# Patient Record
Sex: Male | Born: 1985
Health system: Southern US, Community
[De-identification: ages and names within clinical notes are randomized; demographics above are authoritative.]

## PROBLEM LIST (undated history)

## (undated) DIAGNOSIS — N433 Hydrocele, unspecified: Secondary | ICD-10-CM

## (undated) DIAGNOSIS — K219 Gastro-esophageal reflux disease without esophagitis: Secondary | ICD-10-CM

## (undated) DIAGNOSIS — E785 Hyperlipidemia, unspecified: Secondary | ICD-10-CM

## (undated) HISTORY — PX: HYDROCELE EXCISION: SHX482

## (undated) HISTORY — PX: OTHER SURGICAL HISTORY: SHX169

---

## 2007-10-15 ENCOUNTER — Emergency Department (HOSPITAL_COMMUNITY): Admission: EM | Admit: 2007-10-15 | Discharge: 2007-10-15 | Payer: Self-pay | Admitting: Emergency Medicine

## 2008-08-09 ENCOUNTER — Emergency Department (HOSPITAL_COMMUNITY): Admission: EM | Admit: 2008-08-09 | Discharge: 2008-08-09 | Payer: Self-pay | Admitting: Emergency Medicine

## 2009-08-25 ENCOUNTER — Ambulatory Visit (HOSPITAL_BASED_OUTPATIENT_CLINIC_OR_DEPARTMENT_OTHER): Admission: RE | Admit: 2009-08-25 | Discharge: 2009-08-25 | Payer: Self-pay | Admitting: Urology

## 2010-09-11 LAB — POCT HEMOGLOBIN-HEMACUE: Hemoglobin: 15.2 g/dL (ref 13.0–17.0)

## 2010-10-04 LAB — CBC
MCV: 88 fL (ref 78.0–100.0)
Platelets: 288 10*3/uL (ref 150–400)
RBC: 4.89 MIL/uL (ref 4.22–5.81)
WBC: 8.8 10*3/uL (ref 4.0–10.5)

## 2011-03-14 LAB — URINALYSIS, ROUTINE W REFLEX MICROSCOPIC
Glucose, UA: NEGATIVE
Ketones, ur: NEGATIVE
Nitrite: NEGATIVE
Specific Gravity, Urine: 1.01
pH: 6.5

## 2011-12-24 ENCOUNTER — Ambulatory Visit: Payer: Self-pay | Admitting: Family Medicine

## 2011-12-24 ENCOUNTER — Other Ambulatory Visit: Payer: Self-pay | Admitting: Family Medicine

## 2011-12-24 ENCOUNTER — Ambulatory Visit: Payer: Self-pay | Admitting: Emergency Medicine

## 2011-12-24 VITALS — BP 112/68 | HR 78 | Temp 98.5°F | Resp 16 | Ht 67.0 in | Wt 189.4 lb

## 2011-12-24 DIAGNOSIS — R1013 Epigastric pain: Secondary | ICD-10-CM

## 2011-12-24 DIAGNOSIS — R5383 Other fatigue: Secondary | ICD-10-CM

## 2011-12-24 DIAGNOSIS — E785 Hyperlipidemia, unspecified: Secondary | ICD-10-CM

## 2011-12-24 DIAGNOSIS — R5381 Other malaise: Secondary | ICD-10-CM

## 2011-12-24 LAB — POCT CBC
Granulocyte percent: 64.7 %G (ref 37–80)
HCT, POC: 40.9 % — AB (ref 43.5–53.7)
Hemoglobin: 12.5 g/dL — AB (ref 14.1–18.1)
Lymph, poc: 2 (ref 0.6–3.4)
MCH, POC: 28 pg (ref 27–31.2)
MCHC: 30.6 g/dL — AB (ref 31.8–35.4)
MCV: 91.6 fL (ref 80–97)
MID (cbc): 0.6 (ref 0–0.9)
MPV: 8.1 fL (ref 0–99.8)
POC Granulocyte: 4.8 (ref 2–6.9)
POC LYMPH PERCENT: 26.9 %L (ref 10–50)
POC MID %: 8.4 %M (ref 0–12)
Platelet Count, POC: 311 10*3/uL (ref 142–424)
RBC: 4.47 M/uL — AB (ref 4.69–6.13)
RDW, POC: 13.6 %
WBC: 7.4 10*3/uL (ref 4.6–10.2)

## 2011-12-24 LAB — LIPID PANEL
Cholesterol: 202 mg/dL — ABNORMAL HIGH (ref 0–200)
HDL: 35 mg/dL — ABNORMAL LOW (ref >39)
Total CHOL/HDL Ratio: 5.8 Ratio
Triglycerides: 418 mg/dL — ABNORMAL HIGH (ref <150)

## 2011-12-24 LAB — COMPREHENSIVE METABOLIC PANEL
ALT: 22 U/L (ref 0–53)
AST: 19 U/L (ref 0–37)
Albumin: 5 g/dL (ref 3.5–5.2)
Alkaline Phosphatase: 80 U/L (ref 39–117)
BUN: 9 mg/dL (ref 6–23)
CO2: 27 mEq/L (ref 19–32)
Calcium: 9.7 mg/dL (ref 8.4–10.5)
Chloride: 104 mEq/L (ref 96–112)
Creat: 0.81 mg/dL (ref 0.50–1.35)
Glucose, Bld: 98 mg/dL (ref 70–99)
Potassium: 4.4 mEq/L (ref 3.5–5.3)
Sodium: 138 mEq/L (ref 135–145)
Total Bilirubin: 0.4 mg/dL (ref 0.3–1.2)
Total Protein: 7.2 g/dL (ref 6.0–8.3)

## 2011-12-24 LAB — TSH: TSH: 1.272 u[IU]/mL (ref 0.350–4.500)

## 2011-12-24 MED ORDER — RANITIDINE HCL 300 MG PO TABS: 300.0000 mg | ORAL_TABLET | Freq: Every day | ORAL | Status: DC

## 2011-12-24 NOTE — Progress Notes (Signed)
This is a 26 year old Hispanic man with complaints of fatigue, epigastric distress, and history of elevated triglycerides. 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 He's been under a lot of stress lately he thinks that may be contributing to difficulty sleeping, epigastric distress. He had a cholesterol check last year and was found to have high triglycerides.  Father is an alcoholic and lives in [place]  Objective: HEENT: Without abnormality patient is in no acute distress Chest: Clear Heart: Regular no murmur gallop or rub Abdomen: Soft nontender without HSM, or masses Skin: No rashes or ecchymosis  Assessment: 26 year old under great stress  Plan: Check chemistry survey and lipids Ranitidine when necessary for epigastric distress Consider fluoxetine for stress once labs are back

## 2011-12-25 LAB — FERRITIN: Ferritin: 144 ng/mL (ref 22–322)

## 2012-01-07 ENCOUNTER — Ambulatory Visit: Payer: Self-pay | Admitting: Emergency Medicine

## 2012-01-07 VITALS — BP 126/74 | HR 80 | Temp 97.9°F | Resp 17 | Ht 68.0 in | Wt 190.0 lb

## 2012-01-07 DIAGNOSIS — K219 Gastro-esophageal reflux disease without esophagitis: Secondary | ICD-10-CM

## 2012-01-07 DIAGNOSIS — E781 Pure hyperglyceridemia: Secondary | ICD-10-CM

## 2012-01-07 DIAGNOSIS — G4700 Insomnia, unspecified: Secondary | ICD-10-CM

## 2012-01-07 MED ORDER — FENOFIBRATE 145 MG PO TABS: 145.0000 mg | ORAL_TABLET | Freq: Every day | ORAL | Status: DC

## 2012-01-07 MED ORDER — FLUOXETINE HCL 20 MG PO TABS: 20.0000 mg | ORAL_TABLET | Freq: Every day | ORAL | Status: DC

## 2012-01-07 NOTE — Patient Instructions (Signed)
Hipertrigliceridemia  Dieta para niveles altos de triglicridos en sangre (Hypertriglyceridemia, Diet for High blood levels of Triglycerides) La mayora de las grasas que contienen los alimentos son los triglicridos. Los triglicridos en la sangre se almacenan como grasa en el cuerpo. Niveles altos de triglicridos en la sangre pueden ponerlo en un mayor riesgo de insuficiencia cardaca e ictus.  El nivel normal de triglicridos es menos del 150 mg/dl. Los niveles al lmite de ser altos estn entre 150 y 199 mg/dl. Los niveles altos estn entre 200 - 499 mg/dL, y los [address] altos son mayores que 500 mg/dL. La decisin de Pensions consultant se basa en Education officer, community de un nivel elevado de los mismos. Para personas con niveles de triglicridos altos o en el lmite, el tratamiento incluye prdida de peso y ejercicios. Se recomiendan los medicamentos en aquellas personas con niveles de triglicridos [employer]. Muchas personas que necesitan tratamiento por tener niveles altos de triglicridos tienen sndrome metablico. Esto consiste en un conjunto de trastornos que incluyen: resistencia a la insulina, presin arterial elevada, problemas de coagulacin, colesterol y triglicridos altos. PROCEDIMIENTO PARA EL ANLISIS DE LOS TRIGLICRIDOS  No deber comer nada durante las 4 horas antes del anlisis. El rango normal de triglicridos es de 10 a 250 miligramos por decilitro (mg/dl). Algunas personas podrn Gap Inc extremos (1000 o ms) pero el nivel de triglicridos es muy alto si est por encima de 150 mg/dl, dependiendo de qu otros factores de riesgo tiene para una enfermedad cardaca.   Las personas con niveles altos de triglicridos tambin podrn Warehouse manager altos niveles de colesterol. Si tiene [employer] Electric Power de colesterol y de triglicridos, el riesgo de falla cardaca es mayor que si slo tiene niveles altos de triglicridos. Los Ryerson Inc de colesterol es uno de los factores principales de  riesgo de [place].  CAMBIOS EN SU DIETA Su peso puede afectar el nivel de triglicridos en la [place]. Si est un 20% o ms por encima de su peso ideal, deber Energy Transfer Partners de triglicridos mediante la prdida de [place]. La mejor forma de combatir esto es comer menos y Restaurant manager, fast food. Las grasas proporcionan ms caloras que cualquier otra comida. La mejor forma de perder peso es consumir menos grasa. Slo un 30% de las caloras totales debern provenir de grasas. Menos del 7% de las caloras totales de su dieta debern provenir de grasas saturadas. Una dieta baja en grasas y grasas saturadas es la misma que la que se realiza para Advertising account executive. Al consumir una dieta baja en grasas, perder peso, bajar sus niveles de colesterol y [place] de triglicridos.  Consumir una dieta baja en grasas, en especial grasas saturadas, tambin ayudar a su cuerpo a bajar el nivel de triglicridos. Pregunte al nutricionista como saber cunta grasa puede comer segn el nmero de caloras que el mdico le ha indicado.  El ejercicio, adems de ayudar en la prdida de peso tambin puede ayudarlo a bajar los [place] de triglicridos.   El alcohol puede aumentar los [place] de triglicridos. Podr ser necesario que deje de consumir bebidas alcohlicas.   Demasiada cantidad de carbohidratos en su dieta tambin puede aumentar su nivel de triglicridos en sangre. Algunos carbohidratos complejos son necesarios en su dieta. Entre ellos se puede incluir pan, arroz, patatas, otros vegetales que posean almidn y Medical laboratory scientific officer.   Reduzca los hidratos de carbono "simples". Esto puede incluir azcares puros, dulces, miel, y gelatina sin perder otros nutrientes. Si tiene el tipo de triglicridos Intel  que estn afectados por el tipo de carbohidratos en su dieta, necesitar consumir menos azcar y menos alimentos con azcar. El profesional que lo asiste lo ayudar.   Aadir 2 a 4 gramos de aceite de pescado (AEP + ADH) tambin  puede ayudar a disminuir triglicridos. Consulte con el mdico antes de aadir suplementos a su dieta.  Siga la dieta Mantenga un peso corporal adecuado. El profesional que lo asiste lo ayudar. En general, comer menos y hacer ms ejercicio lo ayudarn a Curator. Puede ser de utilidad que segn un grupo de apoyo para el control del [place]. Consulte con su especialista acerca de los grupos de [place] de control de peso en su zona.  Consuma comidas bajas en grasa. Esto tambin puede ayudar a [employer].  Estos alimentos son bajos en grasas. Coma MS alimentos de los siguientes:   Porotos y guisantes secos; lentejas.   Clara de [employer] Inc.   Requesn bajo en grasa   Pescado.   Cortes magros de carne, como [place], lomito, cuadril, Chiropodist (qutele la grasa extra).   Cereales, pastas y panes integrales.   Leche en polvo descremada sin grasa.   Yogur descremado.   Pollo sin piel.   Queso a base de [address] o semidescremada, como [place], parmesano, [place] fresco, ricota o requesn.  Estos son alimentos Counsellor. Coma MENOS alimentos de los siguientes:   Leche entera y derivados, como queso [place], [place], [place], [place] y [place].   Carnes altas en grasas, como fiambres, embutidos, salchichas de viena, bratwurst, costillas, carne en conserva, carne de cerdo picada y carne picada de vaca sin Herbalist.   Alimentos fritos.  Limite las grasas saturadas en su dieta. Sustituir las grasas insaturadas por saturadas puede ayudarlo a disminuir el nivel de triglicridos. Necesitar leer las etiquetas para saber qu productos contienen grasas saturadas.  Estos alimentos son altos en grasas saturadas. Coma MENOS alimentos de los siguientes:   [place].   Leche entera.   Piel y [place] 60 deg [place] de [place].   Aceite de palma.   Manteca.   M[place]equilla.   Queso crema.   [place].   Margarinas y productos de repostera que contengan los aceites mencionados.   Margarinas  vegetales.   Tripas de cerdo.   Grasas de carnes.   Aceite de coco.   Aceite de almendra de palma.   Grasa de cerdo.   Cremas.   Nata.   [place] salado.   Blanqueadores para caf y cremas no lcteas hechas con los aceites mencionados.   Quesos de East[employer] Kodak.  Utilice grasas no saturadas (poliinsaturadas y monoinsaturadas) de forma moderada. Recuerde, aunque las grasas no saturadas son mejores que las saturadas, [place] debera consumir una dieta baja en grasas totales.  Estos alimentos son altos en grasas no saturadas:   Aceite de canola.   Aceite de girasol.   Mayonesa.   Almendras.   Manes   Pias.   Margarinas hechas con los aceites mencionados.   Aceite de Engineer, production.   Aceite de oliva.   Aguacate   Castaas de caj.   [place] de man.   Semillas de girasol.   Aceite de soja   Aceite de man.   Aceitunas   Pacanes.   Trixie Deis de castilla.   Semillas de calabaza.  Evite el azcar y los alimentos altos en azcar. Esto disminuir los carbohidratos sin disminuir otros nutrientes. El azcar en la sangre va rpidamente al cerebro. Cuando hay un exceso de azcar en la sangre, el hgado podr  utilizarla para producir ms triglicridos. Los azcares tambin contienen caloras con otros nutrientes importantes.  Coma MENOS alimentos de los siguientes:   International aid/development worker, azcar negro, azcar impalpable, jaleas, gelatinas, mermeladas, miel, almbar, melaza, pasteles, dulces, tortas, galletitas, glaseados, pasteles, bebidas cola, gaseosas, refrescos y jugos de frutas y gelatina normal.   Evite el alcohol. El alcohol puede aumentar los niveles de triglicridos, incluso ms que Insurance claims handler. Adems, el alcohol es alto en caloras y bajo en nutrientes. Pida agua mineral o una gaseosa de bajas caloras en vez de una bebida alcohlica.  Sugerencias para planear y preparar alimentos  [place]erva, hornee o ase las carnes en vez de frerlas.   Quite la grasa de la carne y la piel de  las aves antes de cocinarlas.   Aada especias, hierbas, jugo de limn o vinagre a los vegetales en vez de sal, salsas pesadas o salsas de carne.   Utilice una sartn antiadherente sin grasas o use sprays antiadherentes.   Refrigere guisos y caldos. Luego elimine la grasa endurecida que flota en la superficie antes de servir.   Refrigere las grasas de la carne asada y desgrsela para hacer salsas de carne sin grasa.   Coma ms pescado.   Utilice menos [place], margarina y otras grasas en panes o vegetales.   Utilice leche en polvo descremada o reconstituida para cocinar.   Cocine con quesos bajos en grasas.   Sustituya el yogur bajo en grasa o queso cottage en toda o parte de las cremas de las recetas de salsas, bocaditos o ensaladas espesas.   Use mitad yogur y [employer] Media.   Sustituya la crema por leche evaporada descremada. La crema batida puede sustituirse por leche evaporada descremada o reconstituida sin grasas en algunas recetas.   Elija frutas frescas para el postre en vez de alimentos altos en grasas como pasteles o tortas. Las frutas son naturalmente bajas en grasas.  Al comer afuera  Pida aperitivos bajos en grasa como jugos de frutas y vegetales, pasta con vegetales o salsa de [place].   Seleccione sopas normales en vez de sopas crema.   Pida que las salsas y condimentos se le sirvan a un costado. Use menos cantidad de ellos.   Pida comidas horneadas, hervidas, a fuego lento, al vapor, fritas en poco aceite o asadas.   Pida margarina en vez de [place], y [place] pequea cantidad.   Beba agua mineral, t o caf sin azcar o gaseosas dietticas en vez de alcohol o bebidas dulces.  PREGUNTAS Y RESPUESTAS ACERCA DE OTRAS GRASAS EN LA SANGRE:  GRASAS SATURADAS, GRASAS TRANS Y COLESTEROL Qu son las grasas trans? Son un tipo de grasa que se forma cuando los aceites vegetales se endurecen a Glass blower/designer de un proceso llamado hidrogenacin. Este proceso  ayuda a hacer los alimentos ms slidos, darles forma y Economist su conservacin. Las grasas trans tambin se denominan aceites hidrogenados o parcialmente hidrogenados.  Qu tienen que ver las grasas saturadas, las grasas trans y Print production planner en los alimentos con las enfermedades cardacas? Las grasas saturadas, las grasas trans y Print production planner en la dieta elevan los niveles de colesterol LDL "malo" en la [place]. Cuanto ms elevado sea el nivel de colesterol LDL, ms elevado ser el riesgo de sufrir enfermedad cardaca. Las grasas saturadas y las grasas trans elevan el colesterol de [place] similar.  Qu alimentos contienen grasas saturadas, grasas trans y colesterol? Las L[employer] Communications cantidades de grasas saturadas se encuentran en productos animales, como cortes grasos  de carne, piel de ave y productos lcteos enteros como [place], [place], crema y [place], y en aceites vegetales tropicales como el de [place], de almendra de palma y de [place]. Las grasas trans se encuentran a veces en los mismos alimentos como grasas saturadas, como en la [place] vegetal, Environmental health practitioner (en especial las que son duras o en barra), galletas saladas, galletitas, productos de panadera, comidas fritas, aderezo para ensaladas y otros alimentos procesados hechos con aceites vegetales parcialmente hidrogenados. Tambin existen naturalmente pequeas cantidades de grasas trans en algunos productos animales, como lcteos, carne de vaca y cordero. Los United Auto en colesterol incluyen el hgado, otras vsceras, yema de [place], [place], y productos lcteos enteros. Cmo puedo Boston Scientific las nuevas etiquetas de los alimentos para hacer elecciones saludables? Observe la pirmide nutricional en la etiqueta del producto. Elija alimentos bajos en grasas saturadas, grasas trans y colesterol. Para grasas saturadas y colesterol, tambin podr Chemical engineer el Pocentaje de Valor Diario (%VD): 5% VD o menos es bajo, y 20% VD o ms es alto. (No hay  %VD para las grasas trans.) Utilice el panel de la pirmide nutricional para elegir alimentos bajos en grasas saturadas y colesterol, y si las grasas saturadas no aparecen, lea los ingredientes y [place] [employer] productos que contengan aderezos de aceites hidrogenados o parcialmente hidrogenados, que tienden a ser ms altos en grasas trans. PUNTOS A RECORDAR: NECESITAR UN PEQUEO CVT (CAMBIO DE VIDA TERAPUTICO)  Converse con el profesional acerca de su enfermedad cardaca y los pasos que debe tomar para reducir factores de riesgo.   Cambie su dieta. Elija alimentos bajos en grasas saturadas, grasas trans y colesterol.   Aada ejercicio a su rutina diaria si an no lo realiza. Participe en actividad fsica moderada, como una caminata enrgica por al menos 30 minutos preferiblemente [employer]Max. Falta de tiempo? Reparta los 30 minutos en 3 segmentos de 10 minutos durante el da.   Deje de fumar. Si fuma, contctese con el mdico para conversar sobre cmo puede ayudarlo.   No consuma drogas.   Mantenga un peso corporal adecuado.   Mantenga una presin [place].   Contine con el control de grasas en la sangre segn le haya indicado el mdico.  Document Released: 11/23/2009 Document Revised: 05/25/2011 [facility] Patient Information 2012 [place], [place].Reflujo gastroesofgico - Adultos  (Gastroesophageal Reflux Disease, Adult)  El reflujo gastroesofgico ocurre cuando el cido del estmago pasa al esfago. Cuando el cido entra en contacto con el esfago, el cido provoca dolor (inflamacin) en el esfago. Con el tiempo, pueden formarse pequeos agujeros (lceras) en el revestimiento del esfago. CAUSAS   Exceso de Runner, broadcasting/film/video. Esto aplica presin Eli Lilly and Company, lo que hace que el cido del estmago suba hacia el esfago.   El hbito de fumar [place] la produccin de cido en el [place].   El consumo de alcohol. Provoca disminucin de la presin en el esfnter esofgico  inferior (vlvula o anillo de msculo entre el esfago y Investment banker, corporate), permitiendo que el cido del estmago suba hacia el esfago.   Cenas a ltima hora del da y estmago lleno. Aumenta la presin y la produccin de cido en el estmago.   Malformacin en el esfnter esofgico inferior.  A menudo no se halla causa.  SNTOMAS   Ardor y Radiographer, therapeutic parte inferior del pecho detrs del esternn y en la zona media del [place]. Puede ocurrir Toys [employer] Us por semana o ms a menudo.   Dificultad para tragar.  Dolor de Advertising copywriter.   Tos seca.   Sntomas similares al asma que incluyen sensacin de opresin en el pecho, falta de aire y sibilancias.  DIAGNSTICO  El mdico diagnosticar el problema basndose en los sntomas. En algunos casos, se indican radiografas y otras pruebas para verificar si hay complicaciones o para comprobar el estado del [address] y Training and development officer.  TRATAMIENTO  El mdico le indicar medicamentos de venta libre o recetados para ayudar a disminuir la produccin de cido. Consulte con su mdico antes de Corporate investment banker o agregar cualquier medicamento nuevo.  INSTRUCCIONES PARA EL CUIDADO EN EL HOGAR   Modifique los factores que pueda cambiar. Consulte con su mdico para solicitar orientacin relacionada con la prdida de peso, dejar de fumar y el consumo de alcohol.   Evite las comidas y bebidas que [address] [place], [place]:   [place] con cafena o alcohlicas.   Chocolate.   Sabores a Advertising account planner.   Ajo y cebolla.   Comidas muy condimentadas.   Ctricos como naranjas, limones o limas.   Alimentos que contengan tomate, como salsas, [place] y pizza.   Alimentos fritos y Lexicographer.   Evite acostarse durante 3 horas antes de irse a dormir o antes de tomar una siesta.   Haga comidas pequeas durante Glass blower/designer de 3 comidas abundantes.   Use ropas sueltas. No use nada apretado alrededor de la cintura que cause presin en el estmago.   Levante (eleve) la cabecera de la cama  6 a 8 pulgadas (15 a 20 cm) con bloques de madera. Usar almohadas extra no ayuda.   Solo tome medicamentos que se pueden comprar sin receta o recetados para el dolor, Dentist o fiebre, como le indica el mdico.   No tome aspirina, ibuprofeno ni antiinflamatorios no esteroides.  SOLICITE ATENCIN MDICA DE Engelhard Corporation SI:   Goldman Sachs, el cuello, la [place], los dientes o la espalda.   El dolor aumenta o cambia la intensidad o la durancin.   Tiene nuseas, vmitos o sudoracin(diaforesis).   Siente falta de aire o dolor en el pecho, o se desmaya.   Vomita y el vmito tiene [place], es de color [place], [place], negro o es similar a la borra del caf o tiene [place].   Las heces son rojas, sanguinolentas o negras.  Estos sntomas pueden ser signos de [address], como enfermedades cardacas, hemorragias gstrias o sangrado esofgico.  ASEGRESE DE QUE:   Comprende estas instrucciones.   Controlar su enfermedad.   Solicitar ayuda de inmediato si no mejora o si empeora.  Document Released: 03/15/2005 Document Revised: 05/25/2011 [facility] Patient Information 2012 [place], [place].

## 2012-01-07 NOTE — Progress Notes (Signed)
   Date:  01/07/2012   Name:  Todd Wong   DOB:  08-08-1985   MRN:  [id number]  PCP:  No primary provider on file.    Chief Complaint: Follow-up and Hyperlipidemia   History of Present Illness:  Todd Wong is a 26 y.o. very pleasant male patient who presents with the following:  Hyperlipidemia and GERD and insomnia from prior visit.  Was put on H2 blocker and has improvement in GI symptoms.  Denies heartburn or GERD symptoms.  Still not sleeping well.  There is no problem list on file for this patient.   No past medical history on file.  No past surgical history on file.  History  Substance Use Topics  . Smoking status: Never Smoker   . Smokeless tobacco: Not on file  . Alcohol Use: Not on file    No family history on file.  No Known Allergies  Medication list has been reviewed and updated.  Current Outpatient Prescriptions on File Prior to Visit  Medication Sig Dispense Refill  . ranitidine (ZANTAC) 300 MG tablet Take 1 tablet (300 mg total) by mouth at bedtime.  30 tablet  3    Review of Systems:  As per HPI, otherwise negative.    Physical Examination: Filed Vitals:   01/07/12 1401  BP: 126/74  Pulse: 80  Temp: 97.9 F (36.6 C)  Resp: 17   Filed Vitals:   01/07/12 1401  Height: 5\' 8"  (1.727 m)  Weight: 190 lb (86.183 kg)   Body mass index is 28.89 kg/(m^2). Ideal Body Weight: Weight in (lb) to have BMI = 25: 164.1    GEN: WDWN, NAD, Non-toxic, Alert & Oriented x 3 HEENT: Atraumatic, Normocephalic.  Ears and Nose: No external deformity. EXTR: No clubbing/cyanosis/edema NEURO: Normal gait.  PSYCH: Normally interactive. Conversant. Not depressed or anxious appearing.  Calm demeanor.  Abdomen:  Benign   Assessment and Plan: hypertrigeridemia gerd  Follow up 3 months  Carmelina Dane, MD

## 2012-01-07 NOTE — Progress Notes (Deleted)
  Subjective:    Patient ID: Todd Wong, male    DOB: [birth year], 26 y.o.   MRN: [id number]  HPI    Review of Systems     Objective:   Physical Exam        Assessment & Plan:

## 2012-01-08 ENCOUNTER — Telehealth: Payer: Self-pay

## 2012-01-08 NOTE — Telephone Encounter (Signed)
I believe this should be answered by the physician who saw the patient.

## 2012-01-08 NOTE — Telephone Encounter (Signed)
Todd Wong called to stated that her son saw another doctor yesterday (01/07/12) and he Rx'd some medications that she does not understand and would like Dr. Milus Glazier to return her call at [phone].

## 2012-01-08 NOTE — Telephone Encounter (Signed)
ROSA STATES HER SON HAD SEEN ANOTHER DR AND THE MEDICINE HE WAS GIVEN, SHE IS REALLY CONCERNED ABOUT IT AND WOULD LIKE TO SPEAK WITH DR Kenyon Ana ABOUT IT PLEASE CALL [phone]

## 2012-01-09 ENCOUNTER — Telehealth: Payer: Self-pay

## 2012-01-09 NOTE — Telephone Encounter (Signed)
Spoke w/mother who has some concern over pt being Rxd Prozac for sleep by Dr Dareen Piano yesterday because pt is not depressed. She would like for Dr L to evaluate pt since he is his PCP. Advised her that it would be best for pt to RTC to see Dr L to discuss w/her and eval pt for need for medications. Mother agreed and I gave her his schedule this week.

## 2012-01-09 NOTE — Telephone Encounter (Signed)
A friend of Todd Wong called on her behalf to report that she wasn't satisfied with her son's visit and wanted to talk to Dr. Dareen Piano about it. She claims her son was supposed to have lab work done which was not completed. He was just prescribed an antidepressant.  [phone]

## 2012-01-10 ENCOUNTER — Ambulatory Visit: Payer: Self-pay | Admitting: Family Medicine

## 2012-01-10 VITALS — BP 118/74 | HR 68 | Temp 98.4°F | Resp 16 | Ht 67.5 in | Wt 189.0 lb

## 2012-01-10 DIAGNOSIS — R5381 Other malaise: Secondary | ICD-10-CM

## 2012-01-10 DIAGNOSIS — R5383 Other fatigue: Secondary | ICD-10-CM

## 2012-01-10 DIAGNOSIS — E789 Disorder of lipoprotein metabolism, unspecified: Secondary | ICD-10-CM

## 2012-01-10 MED ORDER — CYANOCOBALAMIN 1000 MCG/ML IJ SOLN
1000.0000 ug | Freq: Once | INTRAMUSCULAR | Status: AC
  Administered 2012-01-10: 1000 ug via INTRAMUSCULAR

## 2012-01-10 NOTE — Progress Notes (Signed)
S:  26 yo with fatigue, works at [employer].  Seen two days ago and prescribed fluoxetine.  Mother has had B12 problems and would like to try an injection to see if it will help  Patient never filled his prescription for fluoxetine. Family wants a repeat found on visit because there was miscommunication about prescriptions. He did fill the fenofibrate but osmolar $100 and they want to go back on Pravachol. Results for orders placed in visit on 12/24/11  POCT CBC      Component Value Range   WBC 7.4  4.6 - 10.2 K/uL   Lymph, poc 2.0  0.6 - 3.4   POC LYMPH PERCENT 26.9  10 - 50 %L   MID (cbc) 0.6  0 - 0.9   POC MID % 8.4  0 - 12 %M   POC Granulocyte 4.8  2 - 6.9   Granulocyte percent 64.7  37 - 80 %G   RBC 4.47 (*) 4.69 - 6.13 M/uL   Hemoglobin 12.5 (*) 14.1 - 18.1 g/dL   HCT, POC 36.6 (*) 44.0 - 53.7 %   MCV 91.6  80 - 97 fL   MCH, POC 28.0  27 - 31.2 pg   MCHC 30.6 (*) 31.8 - 35.4 g/dL   RDW, POC 34.7     Platelet Count, POC 311  142 - 424 K/uL   MPV 8.1  0 - 99.8 fL  COMPREHENSIVE METABOLIC PANEL      Component Value Range   Sodium 138  135 - 145 mEq/L   Potassium 4.4  3.5 - 5.3 mEq/L   Chloride 104  96 - 112 mEq/L   CO2 27  19 - 32 mEq/L   Glucose, Bld 98  70 - 99 mg/dL   BUN 9  6 - 23 mg/dL   Creat 4.25  9.56 - 3.87 mg/dL   Total Bilirubin 0.4  0.3 - 1.2 mg/dL   Alkaline Phosphatase 80  39 - 117 U/L   AST 19  0 - 37 U/L   ALT 22  0 - 53 U/L   Total Protein 7.2  6.0 - 8.3 g/dL   Albumin 5.0  3.5 - 5.2 g/dL   Calcium 9.7  8.4 - 56.4 mg/dL  LIPID PANEL      Component Value Range   Cholesterol 202 (*) 0 - 200 mg/dL   Triglycerides 332 (*) <150 mg/dL   HDL 35 (*) >95 mg/dL   Total CHOL/HDL Ratio 5.8     VLDL NOT CALC  0 - 40 mg/dL   LDL Cholesterol      TSH      Component Value Range   TSH 1.272  0.350 - 4.500 uIU/mL   I agreed to restart the Pravachol 40 mg with plan of rechecking lipids in 3 months. I also agreed to give B12 shot 1 mg IM today to see this would  help patient's fatigue.

## 2012-01-10 NOTE — Telephone Encounter (Signed)
Patient came into office today.

## 2015-06-21 ENCOUNTER — Emergency Department (HOSPITAL_COMMUNITY): Payer: BLUE CROSS/BLUE SHIELD

## 2015-06-21 ENCOUNTER — Encounter (HOSPITAL_COMMUNITY): Payer: Self-pay | Admitting: Emergency Medicine

## 2015-06-21 ENCOUNTER — Emergency Department (HOSPITAL_COMMUNITY)
Admission: EM | Admit: 2015-06-21 | Discharge: 2015-06-21 | Disposition: A | Discharge status: Home / Self Care | Payer: BLUE CROSS/BLUE SHIELD | Attending: Emergency Medicine | Admitting: Emergency Medicine

## 2015-06-21 DIAGNOSIS — Y998 Other external cause status: Secondary | ICD-10-CM | POA: Insufficient documentation

## 2015-06-21 DIAGNOSIS — Z79899 Other long term (current) drug therapy: Secondary | ICD-10-CM | POA: Diagnosis not present

## 2015-06-21 DIAGNOSIS — E7800 Pure hypercholesterolemia, unspecified: Secondary | ICD-10-CM | POA: Diagnosis not present

## 2015-06-21 DIAGNOSIS — S82832A Other fracture of upper and lower end of left fibula, initial encounter for closed fracture: Secondary | ICD-10-CM | POA: Insufficient documentation

## 2015-06-21 DIAGNOSIS — S82402A Unspecified fracture of shaft of left fibula, initial encounter for closed fracture: Secondary | ICD-10-CM

## 2015-06-21 DIAGNOSIS — Y9389 Activity, other specified: Secondary | ICD-10-CM | POA: Diagnosis not present

## 2015-06-21 DIAGNOSIS — Y9289 Other specified places as the place of occurrence of the external cause: Secondary | ICD-10-CM | POA: Diagnosis not present

## 2015-06-21 DIAGNOSIS — X58XXXA Exposure to other specified factors, initial encounter: Secondary | ICD-10-CM | POA: Diagnosis not present

## 2015-06-21 DIAGNOSIS — S8992XA Unspecified injury of left lower leg, initial encounter: Secondary | ICD-10-CM | POA: Diagnosis present

## 2015-06-21 NOTE — Discharge Instructions (Signed)
You were seen today regarding your leg injury. Again your x-rays demonstrate a left fibula fracture as well as some widening of your left ankle. You need to follow-up with an orthopedic surgeon outpatient. Call to make arrangements for follow-up as soon as possible. Do not put any weight on your ankle. Use the crutches as instructed.  Fibular Ankle Fracture Treated With or Without Immobilization, Adult A fibular fracture at your ankle is a break (fracture) bone in the smallest of the two bones in your lower leg, located on the outside of your leg (fibula) close to the area at your ankle joint. CAUSES  Rolling your ankle.  Twisting your ankle.  Extreme flexing or extending of your foot.  Severe force on your ankle as when falling from a distance. RISK FACTORS  Jumping activities.  Participation in sports.  Osteoporosis.  Advanced age.  Previous ankle injuries. SIGNS AND SYMPTOMS  Pain.  Swelling.  Inability to put weight on injured ankle.  Bruising.  Bone deformities at site of injury. DIAGNOSIS  This fracture is diagnosed with the help of an X-ray exam. TREATMENT  If the fractured bone did not move out of place it usually will heal without problems and does casting or splinting. If immobilization is needed for comfort or the fractured bone moved out of place and will not heal properly with immobilization, a cast or splint will be used. HOME CARE INSTRUCTIONS   Apply ice to the area of injury:  Put ice in a plastic bag.  Place a towel between your skin and the bag.  Leave the ice on for 20 minutes, 2-3 times a day.  Use crutches as directed. Resume walking without crutches as directed by your health care provider.  Only take over-the-counter or prescription medicines for pain, discomfort, or fever as directed by your health care provider.  If you have a removable splint or boot, do not remove the boot unless directed by your health care provider. SEEK MEDICAL CARE  IF:   You have continued pain or more swelling  The medications do not control the pain. SEEK IMMEDIATE MEDICAL CARE IF:  You develop severe pain in the leg or foot.  Your skin or nails below the injury turn blue or grey or feel cold or numb. MAKE SURE YOU:   Understand these instructions.  Will watch your condition.  Will get help right away if you are not doing well or get worse.   This information is not intended to replace advice given to you by your health care provider. Make sure you discuss any questions you have with your health care provider.   Document Released: 06/05/2005 Document Revised: 06/26/2014 Document Reviewed: 01/15/2013 Elsevier Interactive Patient Education [employer]2016 [employer]vier Inc.

## 2015-06-21 NOTE — ED Notes (Signed)
Pt fractured left fibula yesterday, casted in [place], drove here from [place] yesterday. Pt states he needs to know if he needs to see an orthopedic specialist or not. No problems with leg.

## 2015-06-21 NOTE — ED Provider Notes (Signed)
CSN: [id number]     Arrival date & time 06/21/15  [id number] History   First MD Initiated Contact with Patient 06/21/15 [phone]     No chief complaint on file.    (Consider location/radiation/quality/duration/timing/severity/associated sxs/prior Treatment) HPI Comments: 30 year old male with history of hypercholesterolemia presents with question regarding care for his left fibula fracture. The patient was reportedly in [place] and was seen yesterday at an outside facility after a plane and hurting his leg. The patient was diagnosed with a distal fibula fracture and splinted there. He was made nonweightbearing and put on crutches. He said he was told he may need surgery and so came in today wondering if the surgery could be completed. Denies any swelling or pain of the leg. Reports that he did drive back from [place] yesterday.   History reviewed. No pertinent past medical history. History reviewed. No pertinent past surgical history. History reviewed. No pertinent family history. Social History  Substance Use Topics  . Smoking status: Never Smoker   . Smokeless tobacco: None  . Alcohol Use: None    Review of Systems  Constitutional: Negative for fever, diaphoresis, appetite change and fatigue.  HENT: Negative for congestion.   Respiratory: Negative for chest tightness and shortness of breath.   Cardiovascular: Negative for chest pain and leg swelling.  Gastrointestinal: Negative for nausea, vomiting and abdominal pain.  Genitourinary: Negative for dysuria and hematuria.  Musculoskeletal: Negative for myalgias and back pain.       Left fibula fracture  Skin: Negative for rash.  Hematological: Does not bruise/bleed easily.      Allergies  Review of patient's allergies indicates no known allergies.  Home Medications   Prior to Admission medications   Medication Sig Start Date End Date Taking? Authorizing Provider  fenofibrate 160 MG tablet Take 160 mg by mouth daily. 06/10/15  Yes  Historical Provider, MD  ibuprofen (ADVIL,MOTRIN) 200 MG tablet Take 600 mg by mouth every 6 (six) hours as needed for moderate pain.   Yes Historical Provider, MD  pravastatin (PRAVACHOL) 10 MG tablet Take 10 mg by mouth daily. 04/21/15  Yes Historical Provider, MD  tacrolimus (PROTOPIC) 0.1 % ointment Apply 1 application topically daily. 06/04/15  Yes Historical Provider, MD  fenofibrate (TRICOR) 145 MG tablet Take 1 tablet (145 mg total) by mouth daily. 01/07/12 01/06/13  Carmelina DaneJeffery S Anderson, MD  FLUoxetine (PROZAC) 20 MG tablet Take 1 tablet (20 mg total) by mouth daily. 01/07/12 04/06/12  Carmelina DaneJeffery S Anderson, MD  ranitidine (ZANTAC) 300 MG tablet Take 1 tablet (300 mg total) by mouth at bedtime. Patient taking differently: Take 300 mg by mouth daily as needed for heartburn.  12/24/11 12/23/12  Elvina SidleKurt Lauenstein, MD   BP 147/67 mmHg  Pulse 89  Temp(Src) 98.9 F (37.2 C) (Oral)  Resp 16  SpO2 99% Physical Exam  Constitutional: He is oriented to person, place, and time. He appears well-developed and well-nourished. No distress.  HENT:  Head: Normocephalic and atraumatic.  Right Ear: External ear normal.  Left Ear: External ear normal.  Mouth/Throat: Oropharynx is clear and moist. No oropharyngeal exudate.  Eyes: EOM are normal. Pupils are equal, round, and reactive to light.  Neck: Normal range of motion. Neck supple.  Cardiovascular: Normal rate, regular rhythm, normal heart sounds and intact distal pulses.   No murmur heard. Pulmonary/Chest: Effort normal. No respiratory distress. He has no wheezes. He has no rales.  Abdominal: Soft. He exhibits no distension. There is no tenderness.  Musculoskeletal: He exhibits no  edema.  Left lower extremity in a posterior short leg splint in good alignment. No swelling or discoloration of the left toes. Patient with normal sensation of the left toes.  Neurological: He is alert and oriented to person, place, and time.  Skin: Skin is warm and dry. No rash  noted. He is not diaphoretic.  Vitals reviewed.   ED Course  Procedures (including critical care time) Labs Review Labs Reviewed - No data to display  Imaging Review Dg Tibia/fibula Left  06/21/2015  CLINICAL DATA:  Lower leg pain secondary to left fibula fracture sustained while water skiing yesterday. EXAM: LEFT TIBIA AND FIBULA - 2 VIEW COMPARISON:  None. FINDINGS: There is a spiral fracture of the distal fibular shaft 8 cm above the ankle joint. There is abnormal widening of the distal tibiofibular syndesmosis with abnormal widening of the medial ankle joint space. This is consistent with disruption of the distal tibiofibular syndesmosis and probable disruption of the deltoid ligament. There is an ankle joint effusion. IMPRESSION: Abnormal widening of the medial joint space at the left ankle and abnormal widening of the distal tibiofibular articulation consistent with disruption of the syndesmosis and probably a disruption of the deltoid ligament Slightly comminuted spiral fracture of the distal fibula with minimal displacement. Electronically Signed   By: Francene Boyers M.D.   On: 06/21/2015 11:00   I have personally reviewed and evaluated these images and lab results as part of my medical decision-making.   EKG Interpretation None      MDM  Patient was seen and evaluated in stable condition. Posterior short leg splint in place. X-rays demonstrated a left distal fibula fracture with minimal displacement as well as widening of the medial joint space of the left ankle. Patient already sufficiently immobilized. Discussed with patient and family at length the need to follow-up with an orthopedist and that this needed to be done outpatient. They expressed understanding. Contact information for the orthopedic surgeon on call was provided. Patient was discharged home in stable condition with instruction to continue to use crutches and to not bear any weight on the left leg. He expressed  understanding and agreement with plan of care. Final diagnoses:  Fibula fracture, left, closed, initial encounter    1. Left fibula fracture    Leta Baptist, MD 06/21/15 1139

## 2015-06-21 NOTE — ED Notes (Signed)
LLE elevated and ice applied.

## 2015-06-21 NOTE — ED Notes (Signed)
MD at bedside. 

## 2015-06-22 ENCOUNTER — Encounter (HOSPITAL_BASED_OUTPATIENT_CLINIC_OR_DEPARTMENT_OTHER): Payer: Self-pay | Admitting: *Deleted

## 2015-06-23 ENCOUNTER — Ambulatory Visit: Payer: Self-pay | Admitting: Physician Assistant

## 2015-06-23 NOTE — H&P (Signed)
Todd Wong is an 30 y.o. male.   Chief Complaint: left ankle fracture HPI: Patient is a 30yo spanish speaking male, sustained an injury while ice skating in [place] on 06/20/15, was seen in the [place] 06/21/15 xrays showing syndesmosis disruption and slightly comminuted spiral fracture of the distal fibula with minimal displacement.  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  Visit done thru interpreter in our office.  Medicines takes fenofibrate and pravastatin.  Past Medical History  Diagnosis Date  . Hyperlipidemia   . GERD (gastroesophageal reflux disease)   . Displaced fracture of lateral malleolus of left fibula 06/20/2015    Past Surgical History  Procedure Laterality Date  . Hydrocele excision Right 2007 and 08/25/2009    No family history on file. Social History:  reports that he quit smoking about 3 years ago. He has never used smokeless tobacco. He reports that he does not drink alcohol or use illicit drugs.  Allergies: No Known Allergies   (Not in a hospital admission)  No results found for this or any previous visit (from the past 48 hour(s)). Dg Tibia/fibula Left  06/21/2015  CLINICAL DATA:  Lower leg pain secondary to left fibula fracture sustained while water skiing yesterday. EXAM: LEFT TIBIA AND FIBULA - 2 VIEW COMPARISON:  None. FINDINGS: There is a spiral fracture of the distal fibular shaft 8 cm above the ankle joint. There is abnormal widening of the distal tibiofibular syndesmosis with abnormal widening of the medial ankle joint space. This is consistent with disruption of the distal tibiofibular syndesmosis and probable disruption of the deltoid ligament. There is an ankle joint effusion. IMPRESSION: Abnormal widening of the medial joint space at the left ankle and abnormal widening of the distal tibiofibular articulation consistent with disruption of the syndesmosis and probably a disruption of the deltoid ligament Slightly comminuted spiral fracture of the distal fibula  with minimal displacement. Electronically Signed   By: James  Maxwell M.D.   On: 06/21/2015 11:00    Review of Systems  Musculoskeletal: Positive for joint pain and falls.  All other systems reviewed and are negative.   There were no vitals taken for this visit. Physical Exam  Constitutional: He is oriented to person, place, and time. He appears well-developed and well-nourished. No distress.  HENT:  Head: Normocephalic and atraumatic.  Nose: Nose normal.  Eyes: Conjunctivae and EOM are normal. Pupils are equal, round, and reactive to light.  Neck: Normal range of motion.  Cardiovascular: Normal rate and intact distal pulses.   Respiratory: Effort normal. No respiratory distress.  GI: Soft. He exhibits no distension. There is no tenderness.  Musculoskeletal:       Left ankle: He exhibits decreased range of motion, swelling and ecchymosis. He exhibits no laceration. Tenderness. Lateral malleolus tenderness found.  Neurological: He is alert and oriented to person, place, and time.  Skin: Skin is warm and dry. No rash noted. No erythema.  Psychiatric: He has a normal mood and affect. His behavior is normal.     Assessment/Plan Left distal fibula fracture with syndesmosis disruption  Discussed risks and benefits of ORIF left ankle with syndesmosis fixation and patient wishes to proceed.  Will give it a few more days to let swelling subside a bit, plan on outpatient surgery later this week.  Given percocet in the office today.  Todd Wong 06/23/2015, 9:11 AM    

## 2015-06-24 NOTE — Pre-Procedure Instructions (Signed)
Dorita will be interpreter for pt., per Judy at [facility]; please call [phone] if surgery time changes. 

## 2015-06-25 ENCOUNTER — Ambulatory Visit (HOSPITAL_BASED_OUTPATIENT_CLINIC_OR_DEPARTMENT_OTHER): Payer: BLUE CROSS/BLUE SHIELD | Admitting: Anesthesiology

## 2015-06-25 ENCOUNTER — Ambulatory Visit (HOSPITAL_BASED_OUTPATIENT_CLINIC_OR_DEPARTMENT_OTHER)
Admission: RE | Admit: 2015-06-25 | Discharge: 2015-06-25 | Disposition: A | Discharge status: Home / Self Care | Payer: BLUE CROSS/BLUE SHIELD | Source: Ambulatory Visit | Attending: Orthopedic Surgery | Admitting: Orthopedic Surgery

## 2015-06-25 ENCOUNTER — Encounter (HOSPITAL_BASED_OUTPATIENT_CLINIC_OR_DEPARTMENT_OTHER)
Admission: RE | Disposition: A | Discharge status: Home / Self Care | Payer: Self-pay | Source: Ambulatory Visit | Attending: Orthopedic Surgery

## 2015-06-25 ENCOUNTER — Encounter (HOSPITAL_BASED_OUTPATIENT_CLINIC_OR_DEPARTMENT_OTHER): Payer: Self-pay | Admitting: *Deleted

## 2015-06-25 DIAGNOSIS — S93432A Sprain of tibiofibular ligament of left ankle, initial encounter: Secondary | ICD-10-CM | POA: Insufficient documentation

## 2015-06-25 DIAGNOSIS — E785 Hyperlipidemia, unspecified: Secondary | ICD-10-CM | POA: Diagnosis not present

## 2015-06-25 DIAGNOSIS — S82452A Displaced comminuted fracture of shaft of left fibula, initial encounter for closed fracture: Secondary | ICD-10-CM | POA: Diagnosis not present

## 2015-06-25 DIAGNOSIS — X58XXXA Exposure to other specified factors, initial encounter: Secondary | ICD-10-CM | POA: Insufficient documentation

## 2015-06-25 DIAGNOSIS — Y9321 Activity, ice skating: Secondary | ICD-10-CM | POA: Diagnosis not present

## 2015-06-25 DIAGNOSIS — Z87891 Personal history of nicotine dependence: Secondary | ICD-10-CM | POA: Diagnosis not present

## 2015-06-25 DIAGNOSIS — S82402A Unspecified fracture of shaft of left fibula, initial encounter for closed fracture: Secondary | ICD-10-CM | POA: Diagnosis present

## 2015-06-25 DIAGNOSIS — S82442A Displaced spiral fracture of shaft of left fibula, initial encounter for closed fracture: Secondary | ICD-10-CM | POA: Diagnosis not present

## 2015-06-25 DIAGNOSIS — K219 Gastro-esophageal reflux disease without esophagitis: Secondary | ICD-10-CM | POA: Insufficient documentation

## 2015-06-25 HISTORY — PX: ORIF FIBULA FRACTURE: SHX5114

## 2015-06-25 HISTORY — DX: Gastro-esophageal reflux disease without esophagitis: K21.9

## 2015-06-25 HISTORY — DX: Hyperlipidemia, unspecified: E78.5

## 2015-06-25 SURGERY — OPEN REDUCTION INTERNAL FIXATION (ORIF) FIBULA FRACTURE
Anesthesia: General | Site: Leg Lower | Laterality: Left

## 2015-06-25 MED ORDER — FENTANYL CITRATE (PF) 100 MCG/2ML IJ SOLN
INTRAMUSCULAR | Status: AC
  Filled 2015-06-25: qty 2

## 2015-06-25 MED ORDER — SCOPOLAMINE 1 MG/3DAYS TD PT72: 1.0000 | MEDICATED_PATCH | Freq: Once | TRANSDERMAL | Status: DC | PRN

## 2015-06-25 MED ORDER — LIDOCAINE HCL (CARDIAC) 20 MG/ML IV SOLN
INTRAVENOUS | Status: DC | PRN
  Administered 2015-06-25: 50 mg via INTRAVENOUS

## 2015-06-25 MED ORDER — DEXAMETHASONE SODIUM PHOSPHATE 10 MG/ML IJ SOLN
INTRAMUSCULAR | Status: AC
  Filled 2015-06-25: qty 1

## 2015-06-25 MED ORDER — OXYCODONE HCL 5 MG PO TABS
ORAL_TABLET | ORAL | Status: AC
  Filled 2015-06-25: qty 1

## 2015-06-25 MED ORDER — SUCCINYLCHOLINE CHLORIDE 20 MG/ML IJ SOLN
INTRAMUSCULAR | Status: AC
  Filled 2015-06-25: qty 1

## 2015-06-25 MED ORDER — FENTANYL CITRATE (PF) 100 MCG/2ML IJ SOLN: 25.0000 ug | INTRAMUSCULAR | Status: DC | PRN

## 2015-06-25 MED ORDER — PROPOFOL 10 MG/ML IV BOLUS
INTRAVENOUS | Status: DC | PRN
  Administered 2015-06-25: 260 mg via INTRAVENOUS

## 2015-06-25 MED ORDER — SODIUM CHLORIDE 0.9 % IV SOLN: INTRAVENOUS | Status: DC

## 2015-06-25 MED ORDER — GLYCOPYRROLATE 0.2 MG/ML IJ SOLN: 0.2000 mg | Freq: Once | INTRAMUSCULAR | Status: DC | PRN

## 2015-06-25 MED ORDER — MIDAZOLAM HCL 2 MG/2ML IJ SOLN
INTRAMUSCULAR | Status: AC
  Filled 2015-06-25: qty 2

## 2015-06-25 MED ORDER — OXYCODONE HCL 5 MG PO TABS
5.0000 mg | ORAL_TABLET | Freq: Once | ORAL | Status: AC
Start: 2015-06-25 — End: 2015-06-25
  Administered 2015-06-25: 5 mg via ORAL

## 2015-06-25 MED ORDER — FENTANYL CITRATE (PF) 100 MCG/2ML IJ SOLN
50.0000 ug | INTRAMUSCULAR | Status: AC | PRN
  Administered 2015-06-25 (×2): 25 ug via INTRAVENOUS
  Administered 2015-06-25 (×4): 50 ug via INTRAVENOUS

## 2015-06-25 MED ORDER — CEFAZOLIN SODIUM-DEXTROSE 2-3 GM-% IV SOLR: 2.0000 g | INTRAVENOUS | Status: DC

## 2015-06-25 MED ORDER — LACTATED RINGERS IV SOLN
INTRAVENOUS | Status: DC
  Administered 2015-06-25: 10 mL/h via INTRAVENOUS
  Administered 2015-06-25 (×2): via INTRAVENOUS

## 2015-06-25 MED ORDER — PROPOFOL 10 MG/ML IV BOLUS
INTRAVENOUS | Status: AC
  Filled 2015-06-25: qty 20

## 2015-06-25 MED ORDER — HYDROMORPHONE HCL 1 MG/ML IJ SOLN
0.2500 mg | INTRAMUSCULAR | Status: DC | PRN
  Administered 2015-06-25: 0.5 mg via INTRAVENOUS

## 2015-06-25 MED ORDER — DEXAMETHASONE SODIUM PHOSPHATE 4 MG/ML IJ SOLN
INTRAMUSCULAR | Status: DC | PRN
  Administered 2015-06-25: 10 mg via INTRAVENOUS

## 2015-06-25 MED ORDER — LIDOCAINE HCL (CARDIAC) 20 MG/ML IV SOLN
INTRAVENOUS | Status: AC
  Filled 2015-06-25: qty 5

## 2015-06-25 MED ORDER — PROMETHAZINE HCL 25 MG/ML IJ SOLN: 6.2500 mg | INTRAMUSCULAR | Status: DC | PRN

## 2015-06-25 MED ORDER — CHLORHEXIDINE GLUCONATE 4 % EX LIQD: 60.0000 mL | Freq: Once | CUTANEOUS | Status: DC

## 2015-06-25 MED ORDER — 0.9 % SODIUM CHLORIDE (POUR BTL) OPTIME
TOPICAL | Status: DC | PRN
  Administered 2015-06-25: 1000 mL

## 2015-06-25 MED ORDER — HYDROMORPHONE HCL 1 MG/ML IJ SOLN
INTRAMUSCULAR | Status: AC
  Filled 2015-06-25: qty 1

## 2015-06-25 MED ORDER — MIDAZOLAM HCL 2 MG/2ML IJ SOLN
1.0000 mg | INTRAMUSCULAR | Status: DC | PRN
  Administered 2015-06-25 (×2): 2 mg via INTRAVENOUS

## 2015-06-25 MED ORDER — ONDANSETRON HCL 4 MG/2ML IJ SOLN
INTRAMUSCULAR | Status: AC
  Filled 2015-06-25: qty 2

## 2015-06-25 MED ORDER — PROMETHAZINE HCL 25 MG/ML IJ SOLN
INTRAMUSCULAR | Status: AC
  Filled 2015-06-25: qty 1

## 2015-06-25 MED ORDER — BUPIVACAINE-EPINEPHRINE (PF) 0.5% -1:200000 IJ SOLN
INTRAMUSCULAR | Status: DC | PRN
  Administered 2015-06-25: 30 mL via PERINEURAL

## 2015-06-25 MED ORDER — CEFAZOLIN SODIUM-DEXTROSE 2-3 GM-% IV SOLR
INTRAVENOUS | Status: AC
  Filled 2015-06-25: qty 50

## 2015-06-25 MED ORDER — ONDANSETRON HCL 4 MG/2ML IJ SOLN
INTRAMUSCULAR | Status: DC | PRN
  Administered 2015-06-25: 4 mg via INTRAVENOUS

## 2015-06-25 SURGICAL SUPPLY — 83 items
BANDAGE ACE 4X5 VEL STRL LF (GAUZE/BANDAGES/DRESSINGS) ×2 IMPLANT
BANDAGE ACE 6X5 VEL STRL LF (GAUZE/BANDAGES/DRESSINGS) IMPLANT
BANDAGE ESMARK 6X9 LF (GAUZE/BANDAGES/DRESSINGS) ×1 IMPLANT
BIT DRILL 2.5X2.75 QC CALB (BIT) ×2 IMPLANT
BLADE SURG 15 STRL LF DISP TIS (BLADE) ×1 IMPLANT
BLADE SURG 15 STRL SS (BLADE) ×1
BNDG ESMARK 4X9 LF (GAUZE/BANDAGES/DRESSINGS) IMPLANT
BNDG ESMARK 6X9 LF (GAUZE/BANDAGES/DRESSINGS) ×2
BNDG GAUZE ELAST 4 BULKY (GAUZE/BANDAGES/DRESSINGS) IMPLANT
CANISTER SUCT 1200ML W/VALVE (MISCELLANEOUS) ×2 IMPLANT
COVER BACK TABLE 60X90IN (DRAPES) ×2 IMPLANT
COVER MAYO STAND STRL (DRAPES) ×2 IMPLANT
COVER SURGICAL LIGHT HANDLE (MISCELLANEOUS) ×2 IMPLANT
DECANTER SPIKE VIAL GLASS SM (MISCELLANEOUS) IMPLANT
DRAPE EXTREMITY T 121X128X90 (DRAPE) ×2 IMPLANT
DRAPE OEC MINIVIEW 54X84 (DRAPES) ×2 IMPLANT
DRAPE U-SHAPE 47X51 STRL (DRAPES) ×2 IMPLANT
DRSG ADAPTIC 3X8 NADH LF (GAUZE/BANDAGES/DRESSINGS) ×2 IMPLANT
DRSG EMULSION OIL 3X3 NADH (GAUZE/BANDAGES/DRESSINGS) ×2 IMPLANT
DRSG PAD ABDOMINAL 8X10 ST (GAUZE/BANDAGES/DRESSINGS) ×4 IMPLANT
DURAPREP 26ML APPLICATOR (WOUND CARE) ×2 IMPLANT
ELECT REM PT RETURN 9FT ADLT (ELECTROSURGICAL) ×2
ELECTRODE REM PT RTRN 9FT ADLT (ELECTROSURGICAL) ×1 IMPLANT
FIXATION ZIPTIGHT ANKLE SNDSMS (Ankle) ×1 IMPLANT
GAUZE SPONGE 4X4 12PLY STRL (GAUZE/BANDAGES/DRESSINGS) ×2 IMPLANT
GLOVE BIO SURGEON STRL SZ7.5 (GLOVE) ×4 IMPLANT
GLOVE BIO SURGEON STRL SZ8 (GLOVE) ×2 IMPLANT
GLOVE BIOGEL PI IND STRL 7.0 (GLOVE) ×1 IMPLANT
GLOVE BIOGEL PI IND STRL 8 (GLOVE) ×2 IMPLANT
GLOVE BIOGEL PI INDICATOR 7.0 (GLOVE) ×1
GLOVE BIOGEL PI INDICATOR 8 (GLOVE) ×2
GLOVE ECLIPSE 6.5 STRL STRAW (GLOVE) ×2 IMPLANT
GLOVE SURG ORTHO 8.0 STRL STRW (GLOVE) ×2 IMPLANT
GOWN STRL REUS W/ TWL LRG LVL3 (GOWN DISPOSABLE) ×1 IMPLANT
GOWN STRL REUS W/ TWL XL LVL3 (GOWN DISPOSABLE) ×2 IMPLANT
GOWN STRL REUS W/TWL LRG LVL3 (GOWN DISPOSABLE) ×1
GOWN STRL REUS W/TWL XL LVL3 (GOWN DISPOSABLE) ×2
NEEDLE HYPO 25X1 1.5 SAFETY (NEEDLE) IMPLANT
NS IRRIG 1000ML POUR BTL (IV SOLUTION) ×2 IMPLANT
PACK BASIN DAY SURGERY FS (CUSTOM PROCEDURE TRAY) ×2 IMPLANT
PAD CAST 4YDX4 CTTN HI CHSV (CAST SUPPLIES) ×1 IMPLANT
PADDING CAST ABS 3INX4YD NS (CAST SUPPLIES)
PADDING CAST ABS 4INX4YD NS (CAST SUPPLIES) ×1
PADDING CAST ABS COTTON 3X4 (CAST SUPPLIES) IMPLANT
PADDING CAST ABS COTTON 4X4 ST (CAST SUPPLIES) ×1 IMPLANT
PADDING CAST COTTON 4X4 STRL (CAST SUPPLIES) ×1
PADDING CAST COTTON 6X4 STRL (CAST SUPPLIES) ×2 IMPLANT
PENCIL BUTTON HOLSTER BLD 10FT (ELECTRODE) ×2 IMPLANT
PLATE ACE 3.5MM 12HOLE (Plate) ×2 IMPLANT
SCREW CORTICAL 3.5MM  12MM (Screw) ×1 IMPLANT
SCREW CORTICAL 3.5MM  16MM (Screw) ×4 IMPLANT
SCREW CORTICAL 3.5MM 12MM (Screw) ×1 IMPLANT
SCREW CORTICAL 3.5MM 14MM (Screw) ×10 IMPLANT
SCREW CORTICAL 3.5MM 16MM (Screw) ×4 IMPLANT
SHEET MEDIUM DRAPE 40X70 STRL (DRAPES) IMPLANT
SPLINT FAST PLASTER 5X30 (CAST SUPPLIES) ×25
SPLINT PLASTER CAST FAST 5X30 (CAST SUPPLIES) ×25 IMPLANT
SPONGE LAP 4X18 X RAY DECT (DISPOSABLE) ×2 IMPLANT
STAPLER VISISTAT 35W (STAPLE) IMPLANT
STOCKINETTE 6  STRL (DRAPES) ×1
STOCKINETTE 6 STRL (DRAPES) ×1 IMPLANT
SUCTION FRAZIER HANDLE 10FR (MISCELLANEOUS) ×1
SUCTION TUBE FRAZIER 10FR DISP (MISCELLANEOUS) ×1 IMPLANT
SUT ETHILON 3 0 PS 1 (SUTURE) ×10 IMPLANT
SUT ETHILON 4 0 PS 2 18 (SUTURE) IMPLANT
SUT FIBERWIRE #2 38 T-5 BLUE (SUTURE) ×6
SUT RETRIEVER MED (INSTRUMENTS) ×2 IMPLANT
SUT VIC AB 0 CT1 27 (SUTURE) ×1
SUT VIC AB 0 CT1 27XBRD ANBCTR (SUTURE) ×1 IMPLANT
SUT VIC AB 2-0 CT1 27 (SUTURE) ×2
SUT VIC AB 2-0 CT1 TAPERPNT 27 (SUTURE) ×2 IMPLANT
SUT VIC AB 2-0 PS2 27 (SUTURE) IMPLANT
SUT VIC AB 3-0 SH 27 (SUTURE)
SUT VIC AB 3-0 SH 27X BRD (SUTURE) IMPLANT
SUTURE FIBERWR #2 38 T-5 BLUE (SUTURE) ×3 IMPLANT
SYR BULB 3OZ (MISCELLANEOUS) ×2 IMPLANT
SYR CONTROL 10ML LL (SYRINGE) IMPLANT
TOWEL OR 17X24 6PK STRL BLUE (TOWEL DISPOSABLE) ×2 IMPLANT
TOWEL OR NON WOVEN STRL DISP B (DISPOSABLE) ×2 IMPLANT
TUBE CONNECTING 20X1/4 (TUBING) ×2 IMPLANT
UNDERPAD 30X30 (UNDERPADS AND DIAPERS) ×2 IMPLANT
YANKAUER SUCT BULB TIP NO VENT (SUCTIONS) IMPLANT
ZIPTIGHT ANKLE SYNODESMOSS FIX (Ankle) ×2 IMPLANT

## 2015-06-25 NOTE — Interval H&P Note (Signed)
History and Physical Interval Note:  06/25/2015 9:46 AM  Todd Wong  has presented today for surgery, with the diagnosis of DISPLACED FRACTURE OF LATERAL MALLEOLUS OF UNSPECIFIED FIBULA INITIAL ENCOUNTER FOR CLOSED FRACTURE, DISPLACED FRACTURE OF LATERAL MALLEOLUS OF UNSPECIFIED FIBULA INITIAL ENCOUNTER FOR  The various methods of treatment have been discussed with the patient and family. After consideration of risks, benefits and other options for treatment, the patient has consented to  Procedure(s): LEFT OPEN REDUCTION INTERNAL FIXATION (ORIF) DISTAL FIBULA LATERAL MALLEOLUS WITH SYNDESMOSIS  (Left) as a surgical intervention .  The patient's history has been reviewed, patient examined, no change in status, stable for surgery.  I have reviewed the patient's chart and labs.  Questions were answered to the patient's satisfaction.     Christasia Angeletti JR,W D

## 2015-06-25 NOTE — Anesthesia Procedure Notes (Addendum)
Anesthesia Regional Block:  Popliteal block  Pre-Anesthetic Checklist: ,, timeout performed, Correct Patient, Correct Site, Correct Laterality, Correct Procedure, Correct Position, site marked, Risks and benefits discussed,  Surgical consent,  Pre-op evaluation,  At surgeon's request and post-op pain management  Laterality: Lower and Left  Prep: chloraprep       Needles:  Injection technique: Single-shot  Needle Type: Stimiplex      Needle Gauge: 21 and 21 G    Additional Needles:  Procedures: ultrasound guided (picture in chart) Popliteal block  Nerve Stimulator or Paresthesia:  Response: 0.6 mA,   Additional Responses:   Narrative:  Injection made incrementally with aspirations every 5 mL.  Performed by: Personally   Additional Notes: No pain on injection. No increased resistance to injection. Motor intact immediately after block. Meaningful verbal contact maintained throughout placement of block.   Procedure Name: LMA Insertion Date/Time: 06/25/2015 9:59 AM Performed by: Zenia ResidesPAYNE, Daren Yeagle D Pre-anesthesia Checklist: Patient identified, Emergency Drugs available, Suction available and Patient being monitored Patient Re-evaluated:Patient Re-evaluated prior to inductionOxygen Delivery Method: Circle System Utilized Preoxygenation: Pre-oxygenation with 100% oxygen Intubation Type: IV induction Ventilation: Mask ventilation without difficulty LMA: LMA inserted LMA Size: 5.0 Number of attempts: 1 Airway Equipment and Method: Bite block Placement Confirmation: positive ETCO2 Tube secured with: Tape Dental Injury: Teeth and Oropharynx as per pre-operative assessment

## 2015-06-25 NOTE — Anesthesia Postprocedure Evaluation (Signed)
Anesthesia Post Note  Patient: Todd Wong  Procedure(s) Performed: Procedure(s) (LRB): LEFT OPEN REDUCTION INTERNAL FIXATION (ORIF) DISTAL FIBULA LATERAL MALLEOLUS WITH SYNDESMOSIS  (Left)  Patient location during evaluation: PACU Anesthesia Type: General and Regional Level of consciousness: awake and alert Pain management: pain level controlled Vital Signs Assessment: post-procedure vital signs reviewed and stable Respiratory status: spontaneous breathing, nonlabored ventilation, respiratory function stable and patient connected to nasal cannula oxygen Cardiovascular status: blood pressure returned to baseline and stable Postop Assessment: no signs of nausea or vomiting Anesthetic complications: no    Last Vitals:  Filed Vitals:   06/25/15 1400 06/25/15 1500  BP:  167/96  Pulse: 101 110  Temp:  36.5 C  Resp: 15 14    Last Pain:  Filed Vitals:   06/25/15 1509  PainSc: 7                  Cherri Yera J

## 2015-06-25 NOTE — Transfer of Care (Signed)
Immediate Anesthesia Transfer of Care Note  Patient: Todd Wong  Procedure(s) Performed: Procedure(s): LEFT OPEN REDUCTION INTERNAL FIXATION (ORIF) DISTAL FIBULA LATERAL MALLEOLUS WITH SYNDESMOSIS  (Left)  Patient Location: PACU  Anesthesia Type:General  Level of Consciousness: sedated  Airway & Oxygen Therapy: Patient Spontanous Breathing and Patient connected to face mask oxygen  Post-op Assessment: Report given to RN and Post -op Vital signs reviewed and stable  Post vital signs: Reviewed and stable  Last Vitals:  Filed Vitals:   06/25/15 0930 06/25/15 1242  BP: 125/68 128/64  Pulse: 78   Temp:    Resp: 11     Complications: No apparent anesthesia complications

## 2015-06-25 NOTE — Progress Notes (Signed)
Assisted Dr. Council Mechanicenenny with left, ultrasound guided, popliteal block. Side rails up, monitors on throughout procedure. See vital signs in flow sheet. Tolerated Procedure well.

## 2015-06-25 NOTE — Anesthesia Preprocedure Evaluation (Addendum)
Anesthesia Evaluation  Patient identified by MRN, date of birth, ID band Patient awake    Reviewed: Allergy & Precautions, NPO status , Patient's Chart, lab work & pertinent test results  Airway Mallampati: II  TM Distance: >3 FB Neck ROM: Full    Dental no notable dental hx.    Pulmonary former smoker,    Pulmonary exam normal breath sounds clear to auscultation       Cardiovascular negative cardio ROS Normal cardiovascular exam Rhythm:Regular Rate:Normal     Neuro/Psych negative neurological ROS  negative psych ROS   GI/Hepatic Neg liver ROS, GERD  Medicated,  Endo/Other  negative endocrine ROS  Renal/GU negative Renal ROS  negative genitourinary   Musculoskeletal negative musculoskeletal ROS (+)   Abdominal   Peds negative pediatric ROS (+)  Hematology negative hematology ROS (+)   Anesthesia Other Findings   Reproductive/Obstetrics negative OB ROS                            Anesthesia Physical Anesthesia Plan  ASA: II  Anesthesia Plan: General   Post-op Pain Management: MAC Combined w/ Regional for Post-op pain and GA combined w/ Regional for post-op pain   Induction: Intravenous  Airway Management Planned: LMA  Additional Equipment:   Intra-op Plan:   Post-operative Plan: Extubation in OR  Informed Consent: I have reviewed the patients History and Physical, chart, labs and discussed the procedure including the risks, benefits and alternatives for the proposed anesthesia with the patient or authorized representative who has indicated his/her understanding and acceptance.   Dental advisory given  Plan Discussed with: CRNA  Anesthesia Plan Comments: (Discussed risks of popliteal sciatic nerve block including failure, bleeding, infection, nerve damage.  Popliteal block does not necessarily prevent all pain.  Questions answered.  Patient consents to block.)        Anesthesia Quick Evaluation

## 2015-06-25 NOTE — Discharge Instructions (Signed)
Diet: As you were doing prior to hospitalization   Activity: Increase activity slowly as tolerated  No lifting or driving for 2 weeks  Try to keep the leg elevated as much as possible.    Shower: May shower but need to keep dressing/splint dry  Dressing: you are not to remove dressing/splint until follow up  Weight Bearing: nonweight bearing left leg. Use a walker or  Crutches as instructed.   To prevent constipation: you may use a stool softener such as -  Colace ( over the counter) 100 mg by mouth twice a day  Drink plenty of fluids ( prune juice may be helpful) and high fiber foods  Miralax ( over the counter) for constipation as needed.   Precautions: If you experience chest pain or shortness of breath - call 911 immediately For transfer to the hospital emergency department!!  If you develop a fever greater that 101 F, purulent drainage from wound, increased redness or drainage from wound, or calf pain -- Call the office   Follow- Up Appointment: Please call for an appointment to be seen in 2 weeks  [facility] - [phone]    Regional Anesthesia Blocks  1. Numbness or the inability to move the "blocked" extremity may last from 3-48 hours after placement. The length of time depends on the medication injected and your individual response to the medication. If the numbness is not going away after 48 hours, call your surgeon.  2. The extremity that is blocked will need to be protected until the numbness is gone and the  Strength has returned. Because you cannot feel it, you will need to take extra care to avoid injury. Because it may be weak, you may have difficulty moving it or using it. You may not know what position it is in without looking at it while the block is in effect.  3. For blocks in the legs and feet, returning to weight bearing and walking needs to be done carefully. You will need to wait until the numbness is entirely gone and the strength has returned. You should  be able to move your leg and foot normally before you try and bear weight or walk. You will need someone to be with you when you first try to ensure you do not fall and possibly risk injury.  4. Bruising and tenderness at the needle site are common side effects and will resolve in a few days.  5. Persistent numbness or new problems with movement should be communicated to the surgeon or the [facility] [phone] [facility] [phone]).      Post Anesthesia Home Care Instructions  Activity: Get plenty of rest for the remainder of the day. A responsible adult should stay with you for 24 hours following the procedure.  For the next 24 hours, DO NOT: -Drive a car -Advertising copywriter -Drink alcoholic beverages -Take any medication unless instructed by your physician -Make any legal decisions or sign important papers.  Meals: Start with liquid foods such as gelatin or soup. Progress to regular foods as tolerated. Avoid greasy, spicy, heavy foods. If nausea and/or vomiting occur, drink only clear liquids until the nausea and/or vomiting subsides. Call your physician if vomiting continues.  Special Instructions/Symptoms: Your throat may feel dry or sore from the anesthesia or the breathing tube placed in your throat during surgery. If this causes discomfort, gargle with warm salt water. The discomfort should disappear within 24 hours.  If you had a scopolamine patch  placed behind your ear for the management of post- operative nausea and/or vomiting:  1. The medication in the patch is effective for 72 hours, after which it should be removed.  Wrap patch in a tissue and discard in the trash. Wash hands thoroughly with soap and water. 2. You may remove the patch earlier than 72 hours if you experience unpleasant side effects which may include dry mouth, dizziness or visual disturbances. 3. Avoid touching the patch. Wash your hands with soap and water after contact  with the patch.

## 2015-06-25 NOTE — H&P (View-Only) (Signed)
Todd Wong is an 30 y.o. male.   Chief Complaint: left ankle fracture HPI: Patient is a 29yo spanish speaking male, sustained an injury while ice skating in [place] on 06/20/15, was seen in the [facility] 06/21/15 xrays showing syndesmosis disruption and slightly comminuted spiral fracture of the distal fibula with minimal displacement.  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  Visit done thru interpreter in our office.  Medicines takes fenofibrate and pravastatin.  Past Medical History  Diagnosis Date  . Hyperlipidemia   . GERD (gastroesophageal reflux disease)   . Displaced fracture of lateral malleolus of left fibula 06/20/2015    Past Surgical History  Procedure Laterality Date  . Hydrocele excision Right 2007 and 08/25/2009    No family history on file. Social History:  reports that he quit smoking about 3 years ago. He has never used smokeless tobacco. He reports that he does not drink alcohol or use illicit drugs.  Allergies: No Known Allergies   (Not in a hospital admission)  No results found for this or any previous visit (from the past 48 hour(s)). Dg Tibia/fibula Left  06/21/2015  CLINICAL DATA:  Lower leg pain secondary to left fibula fracture sustained while water skiing yesterday. EXAM: LEFT TIBIA AND FIBULA - 2 VIEW COMPARISON:  None. FINDINGS: There is a spiral fracture of the distal fibular shaft 8 cm above the ankle joint. There is abnormal widening of the distal tibiofibular syndesmosis with abnormal widening of the medial ankle joint space. This is consistent with disruption of the distal tibiofibular syndesmosis and probable disruption of the deltoid ligament. There is an ankle joint effusion. IMPRESSION: Abnormal widening of the medial joint space at the left ankle and abnormal widening of the distal tibiofibular articulation consistent with disruption of the syndesmosis and probably a disruption of the deltoid ligament Slightly comminuted spiral fracture of the distal fibula  with minimal displacement. Electronically Signed   By: Todd BoyersJames  Maxwell M.D.   On: 06/21/2015 11:00    Review of Systems  Musculoskeletal: Positive for joint pain and falls.  All other systems reviewed and are negative.   There were no vitals taken for this visit. Physical Exam  Constitutional: He is oriented to person, place, and time. He appears well-developed and well-nourished. No distress.  HENT:  Head: Normocephalic and atraumatic.  Nose: Nose normal.  Eyes: Conjunctivae and EOM are normal. Pupils are equal, round, and reactive to light.  Neck: Normal range of motion.  Cardiovascular: Normal rate and intact distal pulses.   Respiratory: Effort normal. No respiratory distress.  GI: Soft. He exhibits no distension. There is no tenderness.  Musculoskeletal:       Left ankle: He exhibits decreased range of motion, swelling and ecchymosis. He exhibits no laceration. Tenderness. Lateral malleolus tenderness found.  Neurological: He is alert and oriented to person, place, and time.  Skin: Skin is warm and dry. No rash noted. No erythema.  Psychiatric: He has a normal mood and affect. His behavior is normal.     Assessment/Plan Left distal fibula fracture with syndesmosis disruption  Discussed risks and benefits of ORIF left ankle with syndesmosis fixation and patient wishes to proceed.  Will give it a few more days to let swelling subside a bit, plan on outpatient surgery later this week.  Given percocet in the office today.  Todd Wong, Todd Wong 06/23/2015, 9:11 AM

## 2015-06-25 NOTE — Brief Op Note (Signed)
06/25/2015  12:37 PM  PATIENT:  Todd Wong  30 y.o. male  PRE-OPERATIVE DIAGNOSIS:  DISPLACED FRACTURE OF LATERAL MALLEOLUS OF UNSPECIFIED FIBULA INITIAL ENCOUNTER CLOSED FRACTURE  POST-OPERATIVE DIAGNOSIS:  DISPLACED FRACTURE OF LATERAL MALLEOLUS OF UNSPECIFIED FIBULA INITIAL ENCOUNTER CLOSED FRACTURE  PROCEDURE:  Procedure(s): LEFT OPEN REDUCTION INTERNAL FIXATION (ORIF) DISTAL FIBULA LATERAL MALLEOLUS WITH SYNDESMOSIS  (Left)  SURGEON:  Surgeon(s) and Role:    * Frederico Hammananiel Caffrey, MD - Primary  PHYSICIAN ASSISTANT: Margart SicklesJoshua Legrand Lasser, PA-C  ASSISTANTS:    ANESTHESIA:   regional and general  EBL:  Total I/O In: 1000 [I.V.:1000] Out: -   BLOOD ADMINISTERED:none  DRAINS: none   LOCAL MEDICATIONS USED:  NONE  SPECIMEN:  No Specimen  DISPOSITION OF SPECIMEN:  N/A  COUNTS:  YES  TOURNIQUET:   Total Tourniquet Time Documented: Thigh (Left) - 122 minutes Total: Thigh (Left) - 122 minutes   DICTATION: .Other Dictation: Dictation Number unknown  PLAN OF CARE: Discharge to home after PACU  PATIENT DISPOSITION:  PACU - hemodynamically stable.   Delay start of Pharmacological VTE agent (>24hrs) due to surgical blood loss or risk of bleeding: not applicable

## 2015-06-28 ENCOUNTER — Encounter (HOSPITAL_BASED_OUTPATIENT_CLINIC_OR_DEPARTMENT_OTHER): Payer: Self-pay | Admitting: Orthopedic Surgery

## 2015-06-28 NOTE — Op Note (Signed)
NAME:  Todd Wong, Todd Wong                     ACCOUNT NO.:  MEDICAL RECORD NO.:  [id number]  LOCATION:                                 FACILITY:  PHYSICIAN:  Dyke BrackettW. D. Cove Haydon, M.D.    DATE OF BIRTH:  [birth year]  DATE OF PROCEDURE:  06/25/2015 DATE OF DISCHARGE:                              OPERATIVE REPORT   PREOPERATIVE DIAGNOSIS:  Severely comminuted fibular shaft fracture, with disruption of syndesmosis in medial deltoid.  POSTOPERATIVE DIAGNOSIS:  Severely comminuted fibular shaft fracture, with disruption of syndesmosis in medial deltoid.  OPERATION:  Open reduction and internal fixation, fibular shaft fracture with 12 hole Biomet titanium plate, with fixation of syndesmosis with ZipTight suture bridge.  SURGEON:  Dyke BrackettW. D. Zayonna Ayuso, M.D.  ASSISTANT:  Margart SicklesJoshua Chadwell, PA-C.  TOURNIQUET TIME:  Approximately 1 hour 50 minute.  DESCRIPTION OF PROCEDURE:  He was prepped in the supine position, with incision.  Had a long incision due to the comminuted nature of the fibular fracture, centered over about the mid portion of the fibula or in junction of the middle distal 3rd, extended proximally and distally. We did not have to go to the tip of the fibula; however, we went proximal to gain control of the fracture.  The fracture of the fibula was extremely comminuted, with a large oblique butterfly with a comminution at the fracture site, making reduction difficulty; however, we did obtain basically an anatomic reduction provisionally with a fibular plate fixed proximally, and then fixed it distally.  We used almost every hole, the central hole and the plate around the fracture site could not be used, placed approximately 5 holes proximal, 5 to distal, 1 intervening hole in the central area left void as well as a fixation of a ZipTight suture bridge to reduce the syndesmosis.  We drilled a guide pin across the hole in-line with the syndesmosis.  We had confirmed its position with the  fluoroscopy.  We then passed the device across the syndesmosis to observe the fixation device in the medial side of the tibia to be intact and tighten the syndesmosis under direct vision.  The skin was somewhat tender on the medial side by the device.  We had to make a small counter incision over the medial aspect of the ZipTight.  Wound was copiously irrigated.  Split on the peroneal muscles was closed with Vicryl.  Peroneal nerve was recognized and retracted.  Closure then was affected with the 2-0 Vicryl and nylon. Lightly compressive sterile dressing.  Posterior fiberglass splint applied with the foot in neutral.  Taken recovery room in stable condition.     Dyke BrackettW. D. Bee Hammerschmidt, M.D.     WDC/MEDQ  D:  06/25/2015  T:  06/25/2015  Job:  [id number]

## 2015-09-24 DIAGNOSIS — M25672 Stiffness of left ankle, not elsewhere classified: Secondary | ICD-10-CM | POA: Diagnosis not present

## 2015-09-24 DIAGNOSIS — S8262XD Displaced fracture of lateral malleolus of left fibula, subsequent encounter for closed fracture with routine healing: Secondary | ICD-10-CM | POA: Diagnosis not present

## 2015-09-24 DIAGNOSIS — M25572 Pain in left ankle and joints of left foot: Secondary | ICD-10-CM | POA: Diagnosis not present

## 2015-09-24 DIAGNOSIS — R262 Difficulty in walking, not elsewhere classified: Secondary | ICD-10-CM | POA: Diagnosis not present

## 2015-09-28 DIAGNOSIS — M25672 Stiffness of left ankle, not elsewhere classified: Secondary | ICD-10-CM | POA: Diagnosis not present

## 2015-09-28 DIAGNOSIS — R262 Difficulty in walking, not elsewhere classified: Secondary | ICD-10-CM | POA: Diagnosis not present

## 2015-09-28 DIAGNOSIS — M25572 Pain in left ankle and joints of left foot: Secondary | ICD-10-CM | POA: Diagnosis not present

## 2015-09-28 DIAGNOSIS — S8262XD Displaced fracture of lateral malleolus of left fibula, subsequent encounter for closed fracture with routine healing: Secondary | ICD-10-CM | POA: Diagnosis not present

## 2015-11-29 DIAGNOSIS — M25572 Pain in left ankle and joints of left foot: Secondary | ICD-10-CM | POA: Diagnosis not present

## 2015-12-13 DIAGNOSIS — E785 Hyperlipidemia, unspecified: Secondary | ICD-10-CM | POA: Diagnosis not present

## 2015-12-20 DIAGNOSIS — G4733 Obstructive sleep apnea (adult) (pediatric): Secondary | ICD-10-CM | POA: Diagnosis not present

## 2015-12-20 DIAGNOSIS — E781 Pure hyperglyceridemia: Secondary | ICD-10-CM | POA: Diagnosis not present

## 2015-12-20 DIAGNOSIS — E785 Hyperlipidemia, unspecified: Secondary | ICD-10-CM | POA: Diagnosis not present

## 2015-12-20 DIAGNOSIS — K219 Gastro-esophageal reflux disease without esophagitis: Secondary | ICD-10-CM | POA: Diagnosis not present

## 2016-01-10 DIAGNOSIS — M25572 Pain in left ankle and joints of left foot: Secondary | ICD-10-CM | POA: Diagnosis not present

## 2016-05-19 DIAGNOSIS — Z23 Encounter for immunization: Secondary | ICD-10-CM | POA: Diagnosis not present

## 2016-06-27 ENCOUNTER — Other Ambulatory Visit: Payer: Self-pay | Admitting: Gastroenterology

## 2016-06-27 DIAGNOSIS — R1011 Right upper quadrant pain: Secondary | ICD-10-CM

## 2016-06-27 DIAGNOSIS — R112 Nausea with vomiting, unspecified: Secondary | ICD-10-CM | POA: Diagnosis not present

## 2016-06-27 DIAGNOSIS — K5904 Chronic idiopathic constipation: Secondary | ICD-10-CM | POA: Diagnosis not present

## 2016-06-27 DIAGNOSIS — K219 Gastro-esophageal reflux disease without esophagitis: Secondary | ICD-10-CM | POA: Diagnosis not present

## 2016-06-27 DIAGNOSIS — R1033 Periumbilical pain: Secondary | ICD-10-CM | POA: Diagnosis not present

## 2016-07-03 DIAGNOSIS — R1033 Periumbilical pain: Secondary | ICD-10-CM | POA: Diagnosis not present

## 2016-07-03 DIAGNOSIS — K219 Gastro-esophageal reflux disease without esophagitis: Secondary | ICD-10-CM | POA: Diagnosis not present

## 2016-07-03 DIAGNOSIS — E669 Obesity, unspecified: Secondary | ICD-10-CM | POA: Diagnosis not present

## 2016-07-03 DIAGNOSIS — R112 Nausea with vomiting, unspecified: Secondary | ICD-10-CM | POA: Diagnosis not present

## 2016-07-11 ENCOUNTER — Encounter (HOSPITAL_COMMUNITY)
Admission: RE | Admit: 2016-07-11 | Discharge: 2016-07-11 | Disposition: A | Discharge status: Home / Self Care | Payer: BLUE CROSS/BLUE SHIELD | Source: Ambulatory Visit | Attending: Gastroenterology | Admitting: Gastroenterology

## 2016-07-11 DIAGNOSIS — R1011 Right upper quadrant pain: Secondary | ICD-10-CM | POA: Insufficient documentation

## 2016-07-11 DIAGNOSIS — R112 Nausea with vomiting, unspecified: Secondary | ICD-10-CM | POA: Diagnosis not present

## 2016-07-11 MED ORDER — SINCALIDE 5 MCG IJ SOLR: 0.0200 ug/kg | Freq: Once | INTRAMUSCULAR | Status: DC

## 2016-07-11 MED ORDER — TECHNETIUM TC 99M MEBROFENIN IV KIT: 5.2000 | PACK | Freq: Once | INTRAVENOUS | Status: DC | PRN

## 2016-07-13 DIAGNOSIS — K5904 Chronic idiopathic constipation: Secondary | ICD-10-CM | POA: Diagnosis not present

## 2016-07-13 DIAGNOSIS — K219 Gastro-esophageal reflux disease without esophagitis: Secondary | ICD-10-CM | POA: Diagnosis not present

## 2016-09-29 DIAGNOSIS — E785 Hyperlipidemia, unspecified: Secondary | ICD-10-CM | POA: Diagnosis not present

## 2016-10-05 DIAGNOSIS — K219 Gastro-esophageal reflux disease without esophagitis: Secondary | ICD-10-CM | POA: Diagnosis not present

## 2016-10-05 DIAGNOSIS — E781 Pure hyperglyceridemia: Secondary | ICD-10-CM | POA: Diagnosis not present

## 2016-10-05 DIAGNOSIS — E785 Hyperlipidemia, unspecified: Secondary | ICD-10-CM | POA: Diagnosis not present

## 2016-10-05 DIAGNOSIS — Z0000 Encounter for general adult medical examination without abnormal findings: Secondary | ICD-10-CM | POA: Diagnosis not present

## 2017-02-09 DIAGNOSIS — E785 Hyperlipidemia, unspecified: Secondary | ICD-10-CM | POA: Diagnosis not present

## 2017-02-15 ENCOUNTER — Ambulatory Visit (HOSPITAL_COMMUNITY): Payer: BLUE CROSS/BLUE SHIELD

## 2017-02-15 ENCOUNTER — Other Ambulatory Visit (HOSPITAL_COMMUNITY): Payer: Self-pay | Admitting: Internal Medicine

## 2017-02-15 DIAGNOSIS — E785 Hyperlipidemia, unspecified: Secondary | ICD-10-CM | POA: Diagnosis not present

## 2017-02-15 DIAGNOSIS — K219 Gastro-esophageal reflux disease without esophagitis: Secondary | ICD-10-CM | POA: Diagnosis not present

## 2017-02-15 DIAGNOSIS — K760 Fatty (change of) liver, not elsewhere classified: Secondary | ICD-10-CM | POA: Diagnosis not present

## 2017-02-15 DIAGNOSIS — E781 Pure hyperglyceridemia: Secondary | ICD-10-CM | POA: Diagnosis not present

## 2017-02-15 DIAGNOSIS — N50811 Right testicular pain: Secondary | ICD-10-CM

## 2017-02-21 ENCOUNTER — Ambulatory Visit (HOSPITAL_COMMUNITY): Payer: BLUE CROSS/BLUE SHIELD

## 2017-02-27 ENCOUNTER — Ambulatory Visit (HOSPITAL_COMMUNITY)
Admission: RE | Admit: 2017-02-27 | Discharge: 2017-02-27 | Disposition: A | Discharge status: Home / Self Care | Payer: BLUE CROSS/BLUE SHIELD | Source: Ambulatory Visit | Attending: Internal Medicine | Admitting: Internal Medicine

## 2017-02-27 ENCOUNTER — Other Ambulatory Visit (HOSPITAL_COMMUNITY): Payer: Self-pay | Admitting: Internal Medicine

## 2017-02-27 DIAGNOSIS — N50811 Right testicular pain: Secondary | ICD-10-CM | POA: Diagnosis not present

## 2017-02-27 DIAGNOSIS — N433 Hydrocele, unspecified: Secondary | ICD-10-CM | POA: Diagnosis not present

## 2017-03-25 DIAGNOSIS — Z23 Encounter for immunization: Secondary | ICD-10-CM | POA: Diagnosis not present

## 2017-03-30 DIAGNOSIS — N430 Encysted hydrocele: Secondary | ICD-10-CM | POA: Diagnosis not present

## 2017-06-08 DIAGNOSIS — N430 Encysted hydrocele: Secondary | ICD-10-CM | POA: Diagnosis not present

## 2017-06-26 DIAGNOSIS — N433 Hydrocele, unspecified: Secondary | ICD-10-CM | POA: Diagnosis not present

## 2017-09-07 DIAGNOSIS — E785 Hyperlipidemia, unspecified: Secondary | ICD-10-CM | POA: Diagnosis not present

## 2017-09-07 DIAGNOSIS — K760 Fatty (change of) liver, not elsewhere classified: Secondary | ICD-10-CM | POA: Diagnosis not present

## 2017-09-07 DIAGNOSIS — Z0000 Encounter for general adult medical examination without abnormal findings: Secondary | ICD-10-CM | POA: Diagnosis not present

## 2017-09-07 DIAGNOSIS — E559 Vitamin D deficiency, unspecified: Secondary | ICD-10-CM | POA: Diagnosis not present

## 2017-09-14 DIAGNOSIS — E781 Pure hyperglyceridemia: Secondary | ICD-10-CM | POA: Diagnosis not present

## 2017-09-14 DIAGNOSIS — E785 Hyperlipidemia, unspecified: Secondary | ICD-10-CM | POA: Diagnosis not present

## 2017-09-14 DIAGNOSIS — Z0000 Encounter for general adult medical examination without abnormal findings: Secondary | ICD-10-CM | POA: Diagnosis not present

## 2017-09-14 DIAGNOSIS — K219 Gastro-esophageal reflux disease without esophagitis: Secondary | ICD-10-CM | POA: Diagnosis not present

## 2017-09-20 DIAGNOSIS — N430 Encysted hydrocele: Secondary | ICD-10-CM | POA: Diagnosis not present

## 2017-12-11 DIAGNOSIS — K760 Fatty (change of) liver, not elsewhere classified: Secondary | ICD-10-CM | POA: Diagnosis not present

## 2017-12-11 DIAGNOSIS — E785 Hyperlipidemia, unspecified: Secondary | ICD-10-CM | POA: Diagnosis not present

## 2017-12-18 DIAGNOSIS — E782 Mixed hyperlipidemia: Secondary | ICD-10-CM | POA: Diagnosis not present

## 2017-12-18 DIAGNOSIS — K760 Fatty (change of) liver, not elsewhere classified: Secondary | ICD-10-CM | POA: Diagnosis not present

## 2017-12-18 DIAGNOSIS — M25511 Pain in right shoulder: Secondary | ICD-10-CM | POA: Diagnosis not present

## 2017-12-18 DIAGNOSIS — R748 Abnormal levels of other serum enzymes: Secondary | ICD-10-CM | POA: Diagnosis not present

## 2018-01-17 DIAGNOSIS — N430 Encysted hydrocele: Secondary | ICD-10-CM | POA: Diagnosis not present

## 2018-03-13 DIAGNOSIS — Z0001 Encounter for general adult medical examination with abnormal findings: Secondary | ICD-10-CM | POA: Diagnosis not present

## 2018-03-13 DIAGNOSIS — E781 Pure hyperglyceridemia: Secondary | ICD-10-CM | POA: Diagnosis not present

## 2018-03-20 DIAGNOSIS — E781 Pure hyperglyceridemia: Secondary | ICD-10-CM | POA: Diagnosis not present

## 2018-03-20 DIAGNOSIS — K760 Fatty (change of) liver, not elsewhere classified: Secondary | ICD-10-CM | POA: Diagnosis not present

## 2018-03-20 DIAGNOSIS — Z6834 Body mass index (BMI) 34.0-34.9, adult: Secondary | ICD-10-CM | POA: Diagnosis not present

## 2018-03-20 DIAGNOSIS — Z23 Encounter for immunization: Secondary | ICD-10-CM | POA: Diagnosis not present

## 2018-03-20 DIAGNOSIS — K219 Gastro-esophageal reflux disease without esophagitis: Secondary | ICD-10-CM | POA: Diagnosis not present

## 2018-04-15 ENCOUNTER — Other Ambulatory Visit: Payer: Self-pay | Admitting: Urology

## 2018-04-15 DIAGNOSIS — N430 Encysted hydrocele: Secondary | ICD-10-CM | POA: Diagnosis not present

## 2018-05-02 IMAGING — US US SCROTUM W/ DOPPLER COMPLETE
1 series · 13 of 25 positions shown · non-contrast
Comparison: 08/16/2009 scrotal sonogram.

CLINICAL DATA: 31-year-old male with a few months of right scrotal
pain. Reported history of right hydrocelectomy.

EXAM:
SCROTAL ULTRASOUND
DOPPLER ULTRASOUND OF THE TESTICLES
TECHNIQUE: Complete ultrasound examination of the testicles, epididymis, and
other scrotal structures was performed. Color and spectral Doppler
ultrasound were also utilized to evaluate blood flow to the
testicles.

[Series 1: us scrotum w/ doppler complete · 0.08mm/px · 13 of 50 slices shown]
[im 1/50]
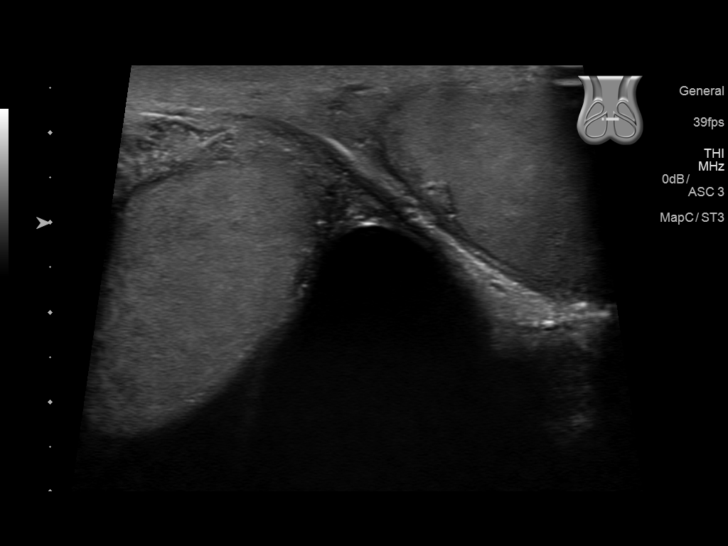
[im 5/50]
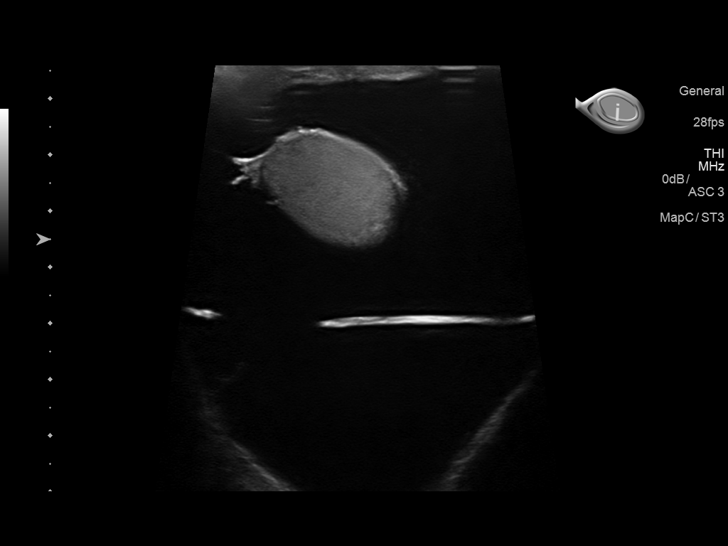
[im 9/50]
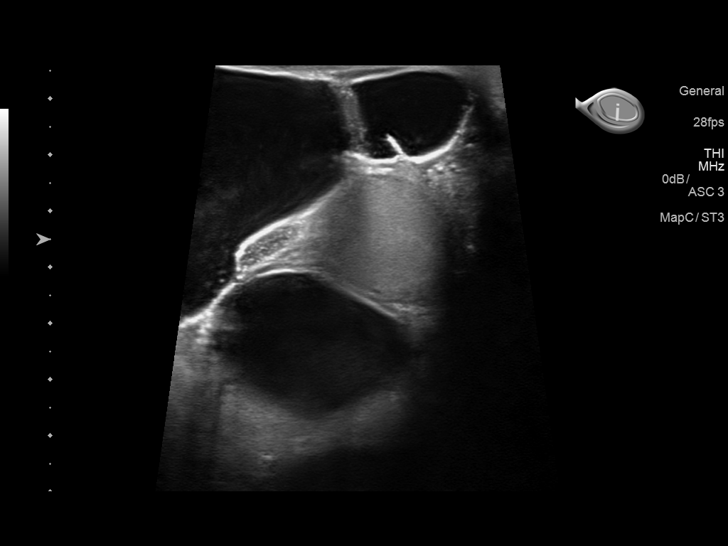
[im 13/50]
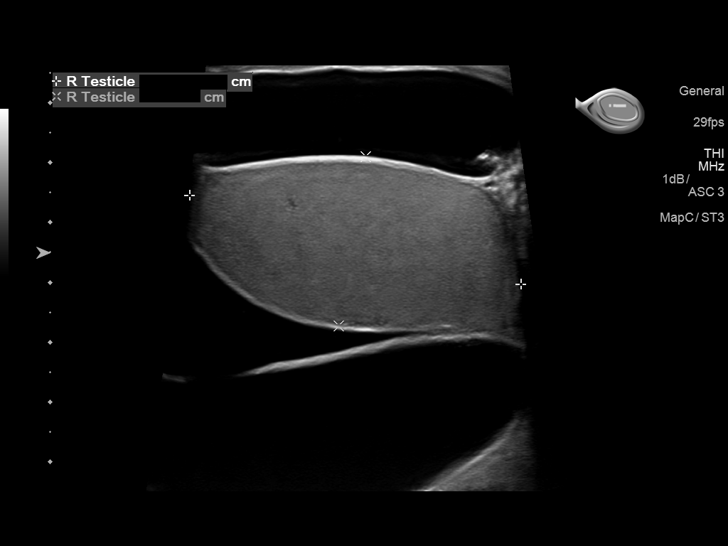
[im 17/50]
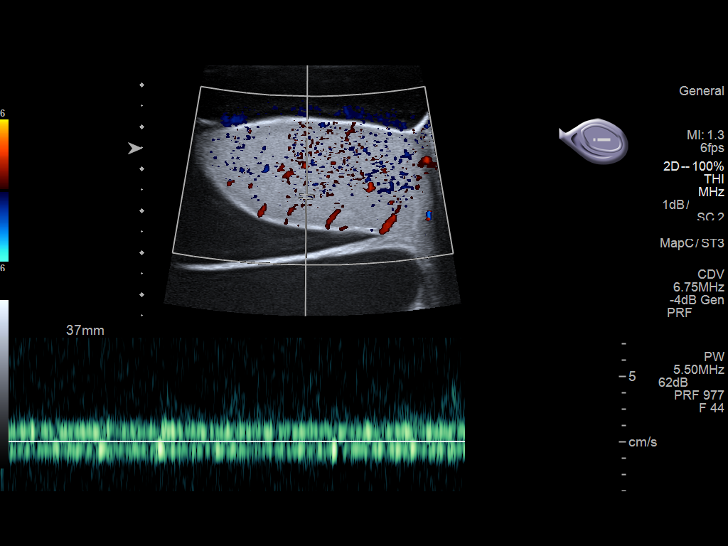
[im 21/50]
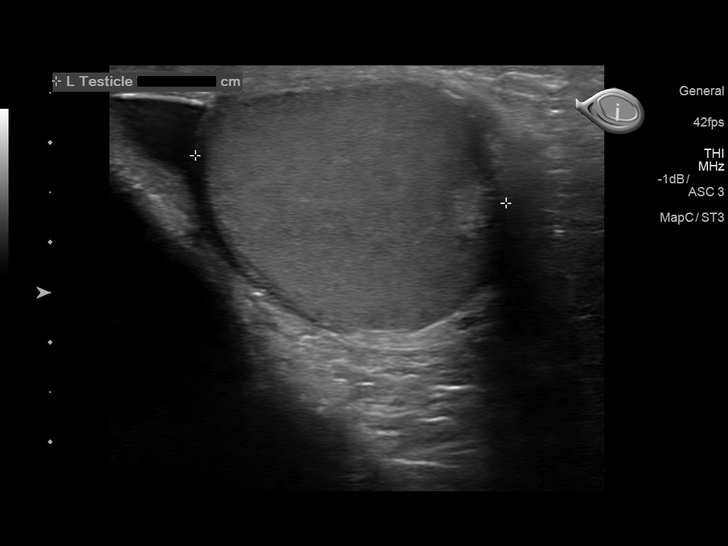
[im 25/50]
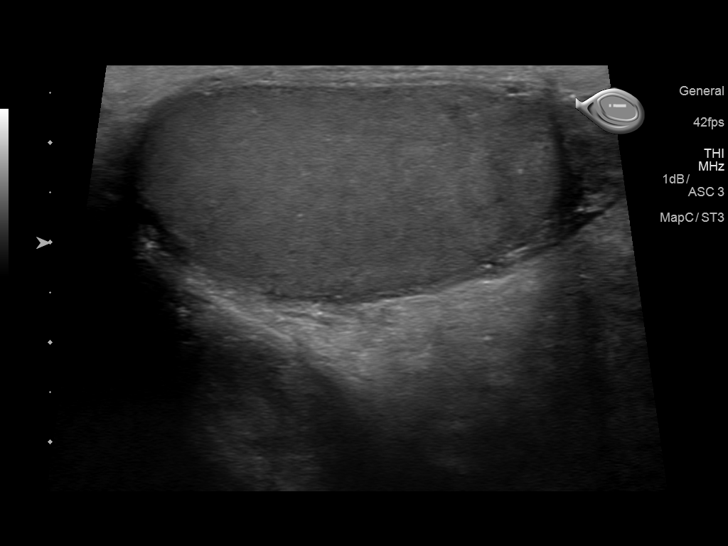
[im 29/50]
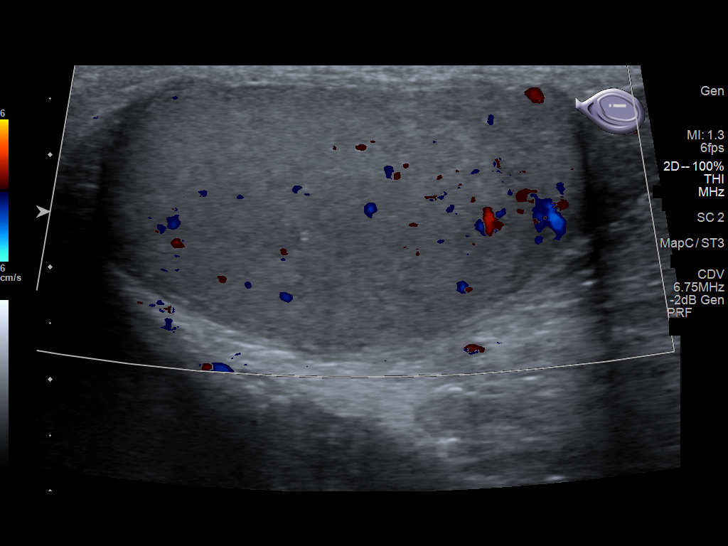
[im 33/50]
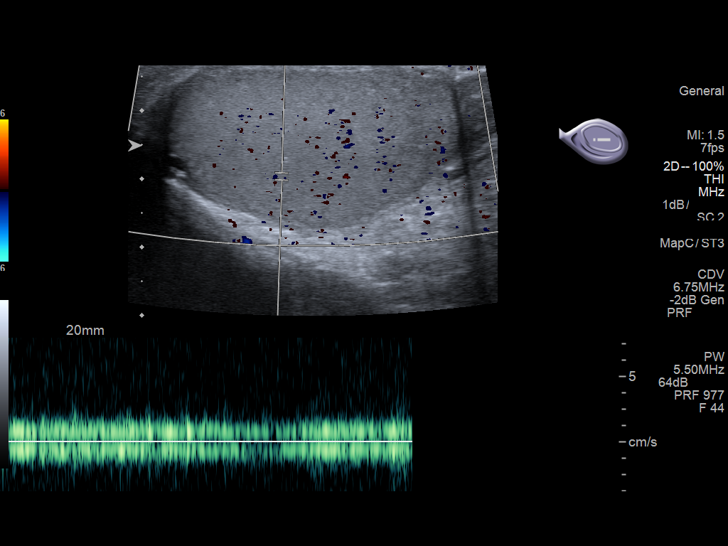
[im 37/50]
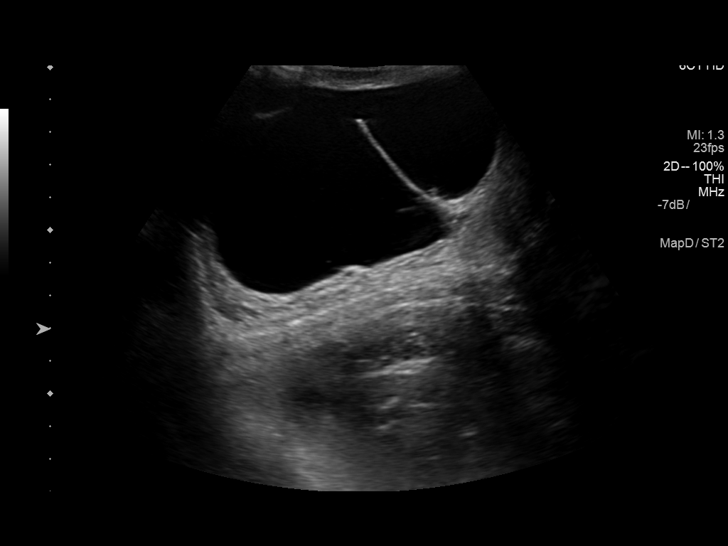
[im 41/50]
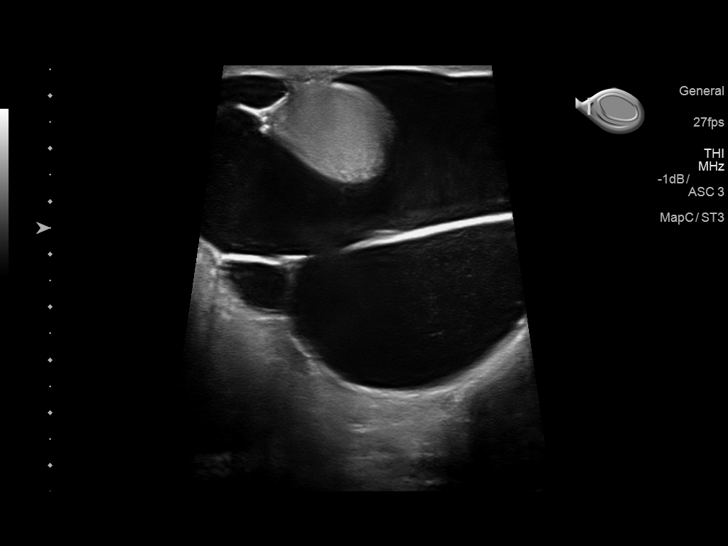
[im 45/50]
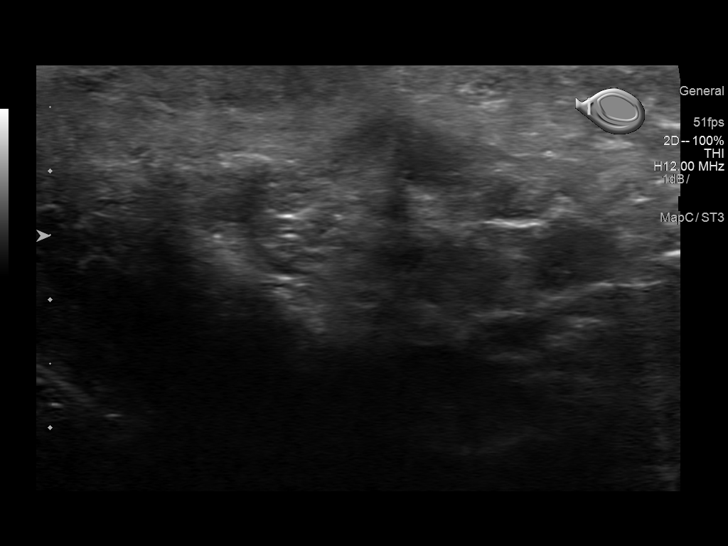
[im 50/50]
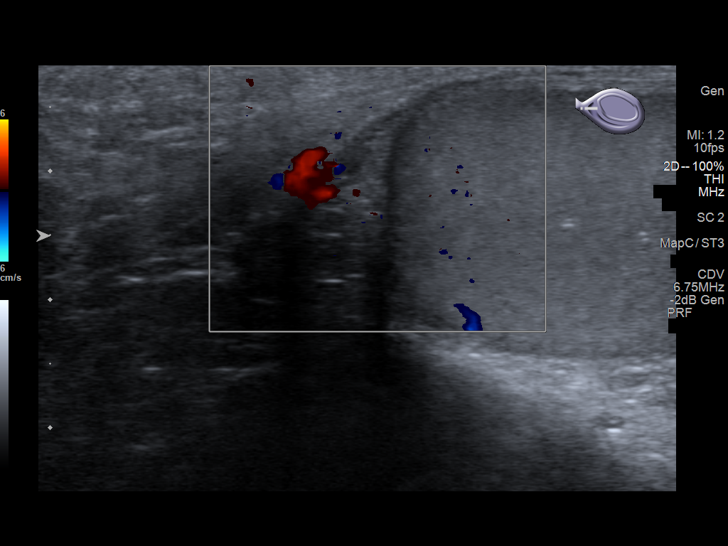

[13 of 25 positions shown; findings below may reference images not displayed]

FINDINGS: Right testicle

Measurements: 5.7 x 2.9 x 3.2 cm. No mass or microlithiasis
visualized.

Left testicle

Measurements: 4.3 x 2.1 x 3.2 cm. No mass or microlithiasis
visualized.

Right epididymis:  Not discretely visualized.

Left epididymis:  Normal in size and appearance.

Hydrocele: Large right hydrocele measuring 12.5 x 7.5 x 9.5 cm with
several mildly thickened internal septations and no solid
components, increased in size from 5.0 x 3.2 x 5.5 cm on 08/16/2009.
No left hydrocele.

Varicocele:  None visualized.

Pulsed Doppler interrogation of both testes demonstrates normal low
resistance arterial and venous waveforms bilaterally.
IMPRESSION: 1. Normal testes with no evidence of testicular torsion.
2. Large septated right hydrocele, significantly increased in size
since 08/16/2009 scrotal sonogram study.

## 2018-05-14 ENCOUNTER — Other Ambulatory Visit: Payer: Self-pay

## 2018-05-14 ENCOUNTER — Encounter (HOSPITAL_BASED_OUTPATIENT_CLINIC_OR_DEPARTMENT_OTHER): Payer: Self-pay | Admitting: *Deleted

## 2018-05-14 NOTE — Progress Notes (Signed)
Spoke w/ pt via phone thru [place] ID # [id number].  Pt verbalized understanding npo after mn w/ exception sip of water with prilosec and arrive at 0930.  Pt needs interpreter.  Requested spanish interpreter via email to interpreting at [place] (printed and placed with chart).

## 2018-05-24 ENCOUNTER — Encounter (HOSPITAL_BASED_OUTPATIENT_CLINIC_OR_DEPARTMENT_OTHER)
Admission: RE | Disposition: A | Discharge status: Home / Self Care | Payer: Self-pay | Source: Ambulatory Visit | Attending: Urology

## 2018-05-24 ENCOUNTER — Encounter (HOSPITAL_BASED_OUTPATIENT_CLINIC_OR_DEPARTMENT_OTHER): Payer: Self-pay | Admitting: *Deleted

## 2018-05-24 ENCOUNTER — Ambulatory Visit (HOSPITAL_BASED_OUTPATIENT_CLINIC_OR_DEPARTMENT_OTHER): Payer: BLUE CROSS/BLUE SHIELD | Admitting: Anesthesiology

## 2018-05-24 ENCOUNTER — Ambulatory Visit (HOSPITAL_BASED_OUTPATIENT_CLINIC_OR_DEPARTMENT_OTHER)
Admission: RE | Admit: 2018-05-24 | Discharge: 2018-05-24 | Disposition: A | Discharge status: Home / Self Care | Payer: BLUE CROSS/BLUE SHIELD | Source: Ambulatory Visit | Attending: Urology | Admitting: Urology

## 2018-05-24 DIAGNOSIS — K219 Gastro-esophageal reflux disease without esophagitis: Secondary | ICD-10-CM | POA: Diagnosis not present

## 2018-05-24 DIAGNOSIS — F17210 Nicotine dependence, cigarettes, uncomplicated: Secondary | ICD-10-CM | POA: Diagnosis not present

## 2018-05-24 DIAGNOSIS — N430 Encysted hydrocele: Secondary | ICD-10-CM | POA: Diagnosis not present

## 2018-05-24 DIAGNOSIS — N433 Hydrocele, unspecified: Secondary | ICD-10-CM | POA: Diagnosis not present

## 2018-05-24 HISTORY — DX: Hydrocele, unspecified: N43.3

## 2018-05-24 HISTORY — PX: HYDROCELE EXCISION: SHX482

## 2018-05-24 SURGERY — HYDROCELECTOMY
Anesthesia: General | Site: Inguinal | Laterality: Right

## 2018-05-24 MED ORDER — PROPOFOL 10 MG/ML IV BOLUS
INTRAVENOUS | Status: AC
  Filled 2018-05-24: qty 40

## 2018-05-24 MED ORDER — ACETAMINOPHEN 500 MG PO TABS
1000.0000 mg | ORAL_TABLET | Freq: Once | ORAL | Status: AC
  Administered 2018-05-24: 1000 mg via ORAL
  Filled 2018-05-24: qty 2

## 2018-05-24 MED ORDER — FENTANYL CITRATE (PF) 100 MCG/2ML IJ SOLN
INTRAMUSCULAR | Status: DC | PRN
  Administered 2018-05-24 (×2): 50 ug via INTRAVENOUS

## 2018-05-24 MED ORDER — FENTANYL CITRATE (PF) 100 MCG/2ML IJ SOLN
INTRAMUSCULAR | Status: AC
  Filled 2018-05-24: qty 2

## 2018-05-24 MED ORDER — CEFAZOLIN SODIUM-DEXTROSE 2-4 GM/100ML-% IV SOLN
2.0000 g | INTRAVENOUS | Status: AC
  Administered 2018-05-24: 2 g via INTRAVENOUS
  Filled 2018-05-24: qty 100

## 2018-05-24 MED ORDER — MIDAZOLAM HCL 2 MG/2ML IJ SOLN
INTRAMUSCULAR | Status: AC
  Filled 2018-05-24: qty 2

## 2018-05-24 MED ORDER — ONDANSETRON HCL 4 MG/2ML IJ SOLN
INTRAMUSCULAR | Status: DC | PRN
  Administered 2018-05-24: 4 mg via INTRAVENOUS

## 2018-05-24 MED ORDER — LIDOCAINE HCL 1 % IJ SOLN
INTRAMUSCULAR | Status: DC | PRN
  Administered 2018-05-24: 100 mg via INTRADERMAL

## 2018-05-24 MED ORDER — TRAMADOL HCL 50 MG PO TABS: 50.0000 mg | ORAL_TABLET | Freq: Four times a day (QID) | ORAL | 0 refills | Status: AC | PRN

## 2018-05-24 MED ORDER — BUPIVACAINE HCL (PF) 0.25 % IJ SOLN
INTRAMUSCULAR | Status: DC | PRN
  Administered 2018-05-24: 14 mL

## 2018-05-24 MED ORDER — ONDANSETRON HCL 4 MG/2ML IJ SOLN
INTRAMUSCULAR | Status: AC
  Filled 2018-05-24: qty 2

## 2018-05-24 MED ORDER — CEFAZOLIN SODIUM-DEXTROSE 2-4 GM/100ML-% IV SOLN
INTRAVENOUS | Status: AC
  Filled 2018-05-24: qty 100

## 2018-05-24 MED ORDER — TRAMADOL HCL 50 MG PO TABS
50.0000 mg | ORAL_TABLET | Freq: Four times a day (QID) | ORAL | Status: DC | PRN
  Administered 2018-05-24: 50 mg via ORAL
  Filled 2018-05-24: qty 1

## 2018-05-24 MED ORDER — DEXAMETHASONE SODIUM PHOSPHATE 10 MG/ML IJ SOLN
INTRAMUSCULAR | Status: AC
  Filled 2018-05-24: qty 1

## 2018-05-24 MED ORDER — PROPOFOL 10 MG/ML IV BOLUS
INTRAVENOUS | Status: DC | PRN
  Administered 2018-05-24: 150 mg via INTRAVENOUS
  Administered 2018-05-24 (×2): 50 mg via INTRAVENOUS

## 2018-05-24 MED ORDER — TRAMADOL HCL 50 MG PO TABS
ORAL_TABLET | ORAL | Status: AC
  Filled 2018-05-24: qty 1

## 2018-05-24 MED ORDER — LACTATED RINGERS IV SOLN
INTRAVENOUS | Status: DC
  Administered 2018-05-24 (×2): via INTRAVENOUS
  Administered 2018-05-24: 1000 mL via INTRAVENOUS
  Filled 2018-05-24: qty 1000

## 2018-05-24 MED ORDER — ACETAMINOPHEN 500 MG PO TABS
ORAL_TABLET | ORAL | Status: AC
  Filled 2018-05-24: qty 2

## 2018-05-24 MED ORDER — FENTANYL CITRATE (PF) 100 MCG/2ML IJ SOLN
25.0000 ug | INTRAMUSCULAR | Status: DC | PRN
  Administered 2018-05-24 (×4): 25 ug via INTRAVENOUS
  Filled 2018-05-24: qty 1

## 2018-05-24 MED ORDER — MIDAZOLAM HCL 5 MG/5ML IJ SOLN
INTRAMUSCULAR | Status: DC | PRN
  Administered 2018-05-24: 2 mg via INTRAVENOUS

## 2018-05-24 SURGICAL SUPPLY — 42 items
BLADE CLIPPER SURG (BLADE) IMPLANT
BLADE SURG 15 STRL LF DISP TIS (BLADE) ×1 IMPLANT
BLADE SURG 15 STRL SS (BLADE) ×1
BNDG GAUZE ELAST 4 BULKY (GAUZE/BANDAGES/DRESSINGS) ×2 IMPLANT
CANISTER SUCT 3000ML PPV (MISCELLANEOUS) ×2 IMPLANT
CANISTER SUCTION 1200CC (MISCELLANEOUS) IMPLANT
CLEANER CAUTERY TIP 5X5 PAD (MISCELLANEOUS) IMPLANT
COVER BACK TABLE 60X90IN (DRAPES) ×2 IMPLANT
COVER MAYO STAND STRL (DRAPES) ×2 IMPLANT
COVER WAND RF STERILE (DRAPES) ×2 IMPLANT
DERMABOND ADVANCED (GAUZE/BANDAGES/DRESSINGS) ×1
DERMABOND ADVANCED .7 DNX12 (GAUZE/BANDAGES/DRESSINGS) ×1 IMPLANT
DISSECTOR ROUND CHERRY 3/8 STR (MISCELLANEOUS) IMPLANT
DRAIN PENROSE 18X1/4 LTX STRL (WOUND CARE) ×2 IMPLANT
DRAPE LAPAROTOMY 100X72 PEDS (DRAPES) ×2 IMPLANT
ELECT NEEDLE TIP 2.8 STRL (NEEDLE) IMPLANT
ELECT REM PT RETURN 9FT ADLT (ELECTROSURGICAL)
ELECTRODE REM PT RTRN 9FT ADLT (ELECTROSURGICAL) IMPLANT
GLOVE BIO SURGEON STRL SZ8 (GLOVE) ×2 IMPLANT
GOWN STRL REUS W/ TWL LRG LVL3 (GOWN DISPOSABLE) ×2 IMPLANT
GOWN STRL REUS W/ TWL XL LVL3 (GOWN DISPOSABLE) ×1 IMPLANT
GOWN STRL REUS W/TWL LRG LVL3 (GOWN DISPOSABLE) ×2
GOWN STRL REUS W/TWL XL LVL3 (GOWN DISPOSABLE) ×1
KIT TURNOVER CYSTO (KITS) ×2 IMPLANT
MANIFOLD NEPTUNE II (INSTRUMENTS) IMPLANT
NEEDLE HYPO 22GX1.5 SAFETY (NEEDLE) IMPLANT
NS IRRIG 500ML POUR BTL (IV SOLUTION) ×2 IMPLANT
PACK BASIN DAY SURGERY FS (CUSTOM PROCEDURE TRAY) ×2 IMPLANT
PAD CLEANER CAUTERY TIP 5X5 (MISCELLANEOUS)
PENCIL BUTTON HOLSTER BLD 10FT (ELECTRODE) ×2 IMPLANT
SUPPORT SCROTAL LG STRP (MISCELLANEOUS) ×2 IMPLANT
SUT CHROMIC 2 0 SH (SUTURE) ×2 IMPLANT
SUT CHROMIC 3 0 SH 27 (SUTURE) ×2 IMPLANT
SUT CHROMIC 4 0 SH 27 (SUTURE) IMPLANT
SUT MNCRL AB 4-0 PS2 18 (SUTURE) ×2 IMPLANT
SYR CONTROL 10ML LL (SYRINGE) ×2 IMPLANT
TOWEL NATURAL 6PK STERILE (DISPOSABLE) ×4 IMPLANT
TOWEL OR 17X24 6PK STRL BLUE (TOWEL DISPOSABLE) ×4 IMPLANT
TRAY DSU PREP LF (CUSTOM PROCEDURE TRAY) ×2 IMPLANT
TUBE CONNECTING 12X1/4 (SUCTIONS) ×2 IMPLANT
WATER STERILE IRR 500ML POUR (IV SOLUTION) ×2 IMPLANT
YANKAUER SUCT BULB TIP NO VENT (SUCTIONS) ×2 IMPLANT

## 2018-05-24 NOTE — Anesthesia Procedure Notes (Signed)
Procedure Name: LMA Insertion Date/Time: 05/24/2018 11:37 AM Performed by: Garth BignessFlynn-Cook, Raechell Singleton A, CRNA Pre-anesthesia Checklist: Patient identified, Emergency Drugs available, Suction available, Patient being monitored and Timeout performed Patient Re-evaluated:Patient Re-evaluated prior to induction Oxygen Delivery Method: Circle system utilized Preoxygenation: Pre-oxygenation with 100% oxygen Induction Type: IV induction Ventilation: Mask ventilation without difficulty LMA: LMA inserted LMA Size: 4.0 Number of attempts: 1 Placement Confirmation: breath sounds checked- equal and bilateral,  CO2 detector and positive ETCO2 Tube secured with: Tape Dental Injury: Teeth and Oropharynx as per pre-operative assessment

## 2018-05-24 NOTE — Anesthesia Postprocedure Evaluation (Signed)
Anesthesia Post Note  Patient: Todd Wong  Procedure(s) Performed: HYDROCELECTOMY ADULT (Right Inguinal)     Patient location during evaluation: PACU Anesthesia Type: General Level of consciousness: awake and alert Pain management: pain level controlled Vital Signs Assessment: post-procedure vital signs reviewed and stable Respiratory status: spontaneous breathing, nonlabored ventilation, respiratory function stable and patient connected to nasal cannula oxygen Cardiovascular status: blood pressure returned to baseline and stable Postop Assessment: no apparent nausea or vomiting Anesthetic complications: no    Last Vitals:  Vitals:   05/24/18 1415 05/24/18 1645  BP: 131/89 111/64  Pulse: 85 74  Resp: 15 14  Temp:  37.1 C  SpO2: 97% 99%    Last Pain:  Vitals:   05/24/18 1645  TempSrc:   PainSc: 5                  Ebunoluwa Gernert L Contrell Ballentine

## 2018-05-24 NOTE — Discharge Instructions (Signed)
Scrotal surgery postoperative instructions ° °Wound: ° °In most cases your incision will have absorbable sutures that will dissolve within the first 2-3 weeks. Some will fall out even earlier. Expect some redness as the sutures dissolve but this should occur only around the sutures. If there is generalized redness, especially with increasing pain or swelling, let us know. The scrotum will very likely get "black and blue" as the blood in the tissues spread. Sometimes the whole scrotum will turn colors. The black and blue is followed by a yellow and brown color. In time, all the discoloration will go away. In some cases some firm swelling in the area of the testicle may persist for up to 4-6 weeks after the surgery and is considered normal in most cases. ° °Drain: ° °If the surgeon placed a drain through the bottom part of your scrotum, it is held in with a small stitch. When instructed, cut the small stitch and slide to drain out. Once the drain has been removed, a small hole made drain out for another day or 2. If so, keep a clean washcloth underneath your supportive undergarment, or sterile gauze. Until the hole seals up, all bathing should be in the shower, and not in the bathtub. ° °Diet: ° °You may return to your normal diet within 24 hours following your surgery. You may note some mild nausea and possibly vomiting the first 6-8 hours following surgery. This is usually due to the side effects of anesthesia, and will disappear quite soon. I would suggest clear liquids and a very light meal the first evening following your surgery. ° °Activity: ° °Your physical activity should be restricted the first 48 hours. During that time you should remain relatively inactive, moving about only when necessary. During the first 7-10 days following surgery you should avoid lifting any heavy objects (anything greater than 15 pounds), and avoid strenuous exercise. If you work, ask us specifically about your restrictions, both for  work and home. We will write a note to your employer if needed. ° °You should plan to wear a tight pair of jockey shorts or an athletic supporter for the first 4-5 days, even to sleep. This will keep the scrotum immobilized to some degree and keep the swelling down. You may find it more comfortable to wear a support longer. ° °Ice packs should be placed on and off over the scrotum for the first 48 hours. Frozen peas or corn in a ZipLock bag can be frozen, used and re-frozen. Fifteen minutes on and 15 minutes off is a reasonable schedule. The ice is a good pain reliever and keeps the swelling down. ° °Hygiene: ° °You may shower 48 hours after your surgery. Tub bathing should be restricted until the seventh day. ° ° ° ° ° ° ° ° ° °Medication: ° °You will be sent home with some type of pain medication. In many cases you will be sent home with a narcotic pain pill (Vicodin or Tylox). If the pain is not too bad, you may take either Tylenol (acetaminophen) or Advil (ibuprofen) which contain no narcotic agents, and might be tolerated a little better, with fewer side effects. If the pain medication you are sent home with does not control the pain, you will have to let us know. Some narcotic pain medications cannot be given or refilled by a phone call to a pharmacy. ° °Problems you should report to us: ° °· Fever of 101.0 degrees Fahrenheit or greater. °· Moderate or severe swelling   under the skin incision or involving the scrotum. °· Drug reaction such as hives, a rash, nausea or vomiting. ° ° °Post Anesthesia Home Care Instructions ° °Activity: °Get plenty of rest for the remainder of the day. A responsible adult should stay with you for 24 hours following the procedure.  °For the next 24 hours, DO NOT: °-Drive a car °-Operate machinery °-Drink alcoholic beverages °-Take any medication unless instructed by your physician °-Make any legal decisions or sign important papers. ° °Meals: °Start with liquid foods such as gelatin  or soup. Progress to regular foods as tolerated. Avoid greasy, spicy, heavy foods. If nausea and/or vomiting occur, drink only clear liquids until the nausea and/or vomiting subsides. Call your physician if vomiting continues. ° °Special Instructions/Symptoms: °Your throat may feel dry or sore from the anesthesia or the breathing tube placed in your throat during surgery. If this causes discomfort, gargle with warm salt water. The discomfort should disappear within 24 hours. ° °If you had a scopolamine patch placed behind your ear for the management of post- operative nausea and/or vomiting: ° °1. The medication in the patch is effective for 72 hours, after which it should be removed.  Wrap patch in a tissue and discard in the trash. Wash hands thoroughly with soap and water. °2. You may remove the patch earlier than 72 hours if you experience unpleasant side effects which may include dry mouth, dizziness or visual disturbances. °3. Avoid touching the patch. Wash your hands with soap and water after contact with the patch. °  ° °

## 2018-05-24 NOTE — Op Note (Signed)
Preoperative diagnosis: Recurrent right hydrocele  Postoperative diagnosis: Same  Principal procedure: Hydrocelectomy, repeated, inguinal approach  Surgeon: Ashlynd Michna  Anesthesia: General  Complications: None  Estimated blood loss: Less than 25 mL  Drains: Quarter-inch Penrose at the base of the scrotum  Specimen: None  Indications: 32 year old left American male who presents for management of recurrent hydrocele.  He [place] the age of 32.  He has had 2 more hydrocelectomy since that time.  He recently presented with right scrotal swelling and discomfort.  He requested surgical management.  I advised the patient that if he was to undergo hydrocelectomy, it would best be done through a scrotal approach to look for patent processes vaginalis or any other inguinal issues that might be leading to his recurrent hydrocele.  The procedure as well as risks and complications including but not limited to infection, recurrent hydrocele, possible loss of testicle, pain have been discussed with the patient.  He understands these and desires to proceed.  Description of procedure: Patient was properly identified and marked in the holding area.  He received preoperative IV antibiotics.  Was taken to the operating room where general anesthetic was administered with the LMA.  He is placed in the supine position.  Genitalia perineum and right inguinal region were prepped and draped.  Proper timeout was performed.  5 cm incision was made in the right lower inguinal region in an oblique fashion.  There was carried down through adipose tissue and using blunt dissection I came upon the right spermatic cord.  It was gently dissected and circumscribed.  Dissection was carried out in an inferior direction, and the hydrocele was identified.  This was dissected circumferentially, separating this from the scrotal wall.  The incision had to be opened slightly to accommodate the  hydrocele.  Following circumferential dissection, taking great care to avoid injury to the cord which was splayed out over the hydrocele, hydrocele was opened in its anterior surface.  Approximately 200 cc of straw-colored fluid was produced.  I then resected the hydrocele sac, taking care to cauterize the edges.  The sac which was resected was not sent for pathology.  The cord was blocked with 10 cc of quarter percent plain Marcaine.  I then created a small puncture in the inferior/anterior scrotal wall, and 1/4 inch Penrose was placed through this, and up through the scrotum along the spermatic cord.  The scrotum was everted through the inguinal incision and no bleeding was seen.  Subcutaneous tissue was also inspected and cautery used on the small oozing areas.  I then infiltrated the wound edges with 10 cc of quarter percent Marcaine.  Deep subcutaneous tissues were approximated using a running suture of 2-0 chromic, taking care not to involve the drain.  I then reapproximated the superficial subcutaneous tissues with a running 3-0 chromic.  Skin edges were then reapproximated using 4-0 Monocryl placed in a running subcuticular fashion.  The 2-0 chromic was used to secure the drain to the skin.  At this point, Dermabond was placed on the inguinal incision.  Fluffs and a jockstrap were placed.  The patient was awakened, taken to PACU in stable condition.  He tolerated procedure well.

## 2018-05-24 NOTE — Transfer of Care (Signed)
Immediate Anesthesia Transfer of Care Note  Patient: Todd Wong  Procedure(s) Performed: HYDROCELECTOMY ADULT (Right Inguinal)  Patient Location: PACU  Anesthesia Type:General  Level of Consciousness: awake, alert  and oriented  Airway & Oxygen Therapy: Patient Spontanous Breathing and Patient connected to face mask oxygen  Post-op Assessment: Report given to RN and Post -op Vital signs reviewed and stable  Post vital signs: Reviewed and stable  Last Vitals:  Vitals Value Taken Time  BP 126/84 05/24/2018 12:45 PM  Temp 36.6 C 05/24/2018 12:35 PM  Pulse 68 05/24/2018 12:46 PM  Resp 12 05/24/2018 12:46 PM  SpO2 100 % 05/24/2018 12:46 PM  Vitals shown include unvalidated device data.  Last Pain:  Vitals:   05/24/18 1235  TempSrc:   PainSc: Asleep      Patients Stated Pain Goal: 6 (05/24/18 1033)  Complications: No apparent anesthesia complications

## 2018-05-24 NOTE — H&P (Signed)
H&P  Chief Complaint: Right hydrocele, recurrent  History of Present Illness:  32 year old male presents at this time for repeat hydrocelectomy.  He had his 1st procedure the age of 32, this was repeated in 2010 by myself.  It again recurred, and he had a 3rd procedure done in 2018.  He presented to me approximately 1 month ago with an uncomfortable recurrence.  He would like to have this repeated.  Past Medical History:  Diagnosis Date  . GERD (gastroesophageal reflux disease)   . Hyperlipidemia   . Right hydrocele     Past Surgical History:  Procedure Laterality Date  . HYDROCELE EXCISION  age 32  . HYDROCELECTOMY Right 06-08-2017;   08-25-2009 (@[facility] )  . ORIF FIBULA FRACTURE Left 06/25/2015   Procedure: LEFT OPEN REDUCTION INTERNAL FIXATION (ORIF) DISTAL FIBULA LATERAL MALLEOLUS WITH SYNDESMOSIS ;  Surgeon: Frederico Hammananiel Caffrey, MD;  Location: [facility];  Service: Orthopedics;  Laterality: Left;    Home Medications:    Allergies: No Known Allergies  History reviewed. No pertinent family history.  Social History:  reports that he has been smoking cigarettes. He has smoked for the past 5.00 years. He has never used smokeless tobacco. He reports that he drinks alcohol. He reports that he does not use drugs.  ROS: A complete review of systems was performed.  All systems are negative except for pertinent findings as noted.  Physical Exam:  Vital signs in last 24 hours: Temp:  [98.8 F (37.1 C)] 98.8 F (37.1 C) (12/06 0921) Pulse Rate:  [64] 64 (12/06 0921) Resp:  [16] 16 (12/06 0921) BP: (119)/(84) 119/84 (12/06 0921) SpO2:  [99 %] 99 % (12/06 0921) Weight:  [96.9 kg] 96.9 kg (12/06 0921) Constitutional:  Alert and oriented, No acute distress Cardiovascular: Regular rate  Respiratory: Normal respiratory effort GI: Abdomen is soft, nontender, nondistended, no abdominal masses. No CVAT.  Genitourinary: Normal male phallus,   Recurrent right hydrocele Lymphatic: No  lymphadenopathy Neurologic: Grossly intact, no focal deficits Psychiatric: Normal mood and affect  Laboratory Data:  No results for input(s): WBC, HGB, HCT, PLT in the last 72 hours.  No results for input(s): NA, K, CL, GLUCOSE, BUN, CALCIUM, CREATININE in the last 72 hours.  Invalid input(s): CO3   No results found for this or any previous visit (from the past 24 hour(s)). No results found for this or any previous visit (from the past 240 hour(s)).  Renal Function: No results for input(s): CREATININE in the last 168 hours. CrCl cannot be calculated (Patient's most recent lab result is older than the maximum 21 days allowed.).  Radiologic Imaging: No results found.  Impression/Assessment:   recurrent right hydrocele  Plan:   right hydrocelectomy, inguinal approach

## 2018-05-24 NOTE — Anesthesia Preprocedure Evaluation (Addendum)
Anesthesia Evaluation  Patient identified by MRN, date of birth, ID band Patient awake    Reviewed: Allergy & Precautions, NPO status , Patient's Chart, lab work & pertinent test results  Airway Mallampati: II  TM Distance: >3 FB Neck ROM: Full    Dental no notable dental hx. (+) Teeth Intact, Dental Advisory Given   Pulmonary neg pulmonary ROS, Current Smoker,    Pulmonary exam normal breath sounds clear to auscultation       Cardiovascular negative cardio ROS Normal cardiovascular exam Rhythm:Regular Rate:Normal     Neuro/Psych negative neurological ROS  negative psych ROS   GI/Hepatic Neg liver ROS, GERD  ,  Endo/Other  negative endocrine ROS  Renal/GU negative Renal ROS  negative genitourinary   Musculoskeletal negative musculoskeletal ROS (+)   Abdominal   Peds  Hematology negative hematology ROS (+)   Anesthesia Other Findings Right hydrocele  Reproductive/Obstetrics                            Anesthesia Physical Anesthesia Plan  ASA: II  Anesthesia Plan: General   Post-op Pain Management:    Induction: Intravenous  PONV Risk Score and Plan: 1 and Ondansetron, Dexamethasone and Midazolam  Airway Management Planned: LMA  Additional Equipment:   Intra-op Plan:   Post-operative Plan: Extubation in OR  Informed Consent: I have reviewed the patients History and Physical, chart, labs and discussed the procedure including the risks, benefits and alternatives for the proposed anesthesia with the patient or authorized representative who has indicated his/her understanding and acceptance.   Dental advisory given  Plan Discussed with: CRNA  Anesthesia Plan Comments:         Anesthesia Quick Evaluation

## 2018-05-24 NOTE — Interval H&P Note (Signed)
History and Physical Interval Note:  05/24/2018 11:23 AM  Todd Wong  has presented today for surgery, with the diagnosis of RIGHT HYDROCELE  The various methods of treatment have been discussed with the patient and family. After consideration of risks, benefits and other options for treatment, the patient has consented to  Procedure(s) with comments: HYDROCELECTOMY ADULT (Right) - 75 MINS as a surgical intervention .  The patient's history has been reviewed, patient examined, no change in status, stable for surgery.  I have reviewed the patient's chart and labs.  Questions were answered to the patient's satisfaction.     Bertram MillardStephen M Shacola Schussler

## 2018-05-27 ENCOUNTER — Encounter (HOSPITAL_BASED_OUTPATIENT_CLINIC_OR_DEPARTMENT_OTHER): Payer: Self-pay | Admitting: Urology

## 2018-05-27 NOTE — Addendum Note (Signed)
Addendum  created 05/27/18 1303 by Briant Sitesenenny, Gera Inboden T, CRNA   Charge Capture section accepted

## 2018-06-10 ENCOUNTER — Encounter (HOSPITAL_COMMUNITY): Payer: Self-pay | Admitting: *Deleted

## 2018-06-10 ENCOUNTER — Ambulatory Visit (HOSPITAL_COMMUNITY): Payer: BLUE CROSS/BLUE SHIELD | Admitting: Anesthesiology

## 2018-06-10 ENCOUNTER — Observation Stay (HOSPITAL_COMMUNITY)
Admission: RE | Admit: 2018-06-10 | Discharge: 2018-06-11 | Disposition: A | Discharge status: Home / Self Care | Payer: BLUE CROSS/BLUE SHIELD | Source: Other Acute Inpatient Hospital | Attending: Urology | Admitting: Urology

## 2018-06-10 ENCOUNTER — Other Ambulatory Visit: Payer: Self-pay

## 2018-06-10 ENCOUNTER — Encounter (HOSPITAL_COMMUNITY)
Admission: RE | Disposition: A | Discharge status: Home / Self Care | Payer: Self-pay | Source: Other Acute Inpatient Hospital | Attending: Urology

## 2018-06-10 ENCOUNTER — Other Ambulatory Visit: Payer: Self-pay | Admitting: Urology

## 2018-06-10 DIAGNOSIS — N99820 Postprocedural hemorrhage and hematoma of a genitourinary system organ or structure following a genitourinary system procedure: Secondary | ICD-10-CM | POA: Diagnosis not present

## 2018-06-10 DIAGNOSIS — E785 Hyperlipidemia, unspecified: Secondary | ICD-10-CM | POA: Diagnosis not present

## 2018-06-10 DIAGNOSIS — K219 Gastro-esophageal reflux disease without esophagitis: Secondary | ICD-10-CM | POA: Insufficient documentation

## 2018-06-10 DIAGNOSIS — N492 Inflammatory disorders of scrotum: Secondary | ICD-10-CM | POA: Diagnosis not present

## 2018-06-10 DIAGNOSIS — N5089 Other specified disorders of the male genital organs: Secondary | ICD-10-CM | POA: Insufficient documentation

## 2018-06-10 DIAGNOSIS — X58XXXA Exposure to other specified factors, initial encounter: Secondary | ICD-10-CM | POA: Insufficient documentation

## 2018-06-10 DIAGNOSIS — F17210 Nicotine dependence, cigarettes, uncomplicated: Secondary | ICD-10-CM | POA: Diagnosis not present

## 2018-06-10 DIAGNOSIS — Z79899 Other long term (current) drug therapy: Secondary | ICD-10-CM | POA: Insufficient documentation

## 2018-06-10 DIAGNOSIS — S3022XA Contusion of scrotum and testes, initial encounter: Principal | ICD-10-CM | POA: Diagnosis present

## 2018-06-10 DIAGNOSIS — N99840 Postprocedural hematoma of a genitourinary system organ or structure following a genitourinary system procedure: Secondary | ICD-10-CM | POA: Diagnosis not present

## 2018-06-10 HISTORY — PX: SCROTAL EXPLORATION: SHX2386

## 2018-06-10 SURGERY — EXPLORATION, SCROTUM
Anesthesia: General | Site: Scrotum

## 2018-06-10 MED ORDER — PROPOFOL 10 MG/ML IV BOLUS
INTRAVENOUS | Status: AC
  Filled 2018-06-10: qty 20

## 2018-06-10 MED ORDER — SODIUM CHLORIDE 0.9 % IV SOLN
INTRAVENOUS | Status: AC
  Filled 2018-06-10: qty 500000

## 2018-06-10 MED ORDER — FENTANYL CITRATE (PF) 100 MCG/2ML IJ SOLN
INTRAMUSCULAR | Status: AC
  Filled 2018-06-10: qty 2

## 2018-06-10 MED ORDER — DEXAMETHASONE SODIUM PHOSPHATE 10 MG/ML IJ SOLN
INTRAMUSCULAR | Status: DC | PRN
  Administered 2018-06-10: 10 mg via INTRAVENOUS

## 2018-06-10 MED ORDER — ONDANSETRON HCL 4 MG/2ML IJ SOLN: 4.0000 mg | INTRAMUSCULAR | Status: DC | PRN

## 2018-06-10 MED ORDER — ONDANSETRON HCL 4 MG/2ML IJ SOLN
INTRAMUSCULAR | Status: DC | PRN
  Administered 2018-06-10: 4 mg via INTRAVENOUS

## 2018-06-10 MED ORDER — ZOLPIDEM TARTRATE 5 MG PO TABS: 5.0000 mg | ORAL_TABLET | Freq: Every evening | ORAL | Status: DC | PRN

## 2018-06-10 MED ORDER — CEFAZOLIN SODIUM-DEXTROSE 2-4 GM/100ML-% IV SOLN
2.0000 g | INTRAVENOUS | Status: AC
  Administered 2018-06-10: 2 g via INTRAVENOUS
  Filled 2018-06-10: qty 100

## 2018-06-10 MED ORDER — FENOFIBRATE 160 MG PO TABS: 160.0000 mg | ORAL_TABLET | Freq: Every evening | ORAL | Status: DC

## 2018-06-10 MED ORDER — MIDAZOLAM HCL 2 MG/2ML IJ SOLN
INTRAMUSCULAR | Status: AC
  Filled 2018-06-10: qty 2

## 2018-06-10 MED ORDER — SODIUM CHLORIDE 0.45 % IV SOLN
INTRAVENOUS | Status: DC
  Administered 2018-06-10: 22:00:00 via INTRAVENOUS

## 2018-06-10 MED ORDER — LIDOCAINE 2% (20 MG/ML) 5 ML SYRINGE
INTRAMUSCULAR | Status: DC | PRN
  Administered 2018-06-10: 100 mg via INTRAVENOUS

## 2018-06-10 MED ORDER — ACETAMINOPHEN 325 MG PO TABS: 650.0000 mg | ORAL_TABLET | ORAL | Status: DC | PRN

## 2018-06-10 MED ORDER — 0.9 % SODIUM CHLORIDE (POUR BTL) OPTIME
TOPICAL | Status: DC | PRN
  Administered 2018-06-10: 1000 mL

## 2018-06-10 MED ORDER — OXYCODONE HCL 5 MG/5ML PO SOLN: 5.0000 mg | Freq: Once | ORAL | Status: DC | PRN

## 2018-06-10 MED ORDER — PANTOPRAZOLE SODIUM 40 MG PO TBEC: 40.0000 mg | DELAYED_RELEASE_TABLET | Freq: Every day | ORAL | Status: DC

## 2018-06-10 MED ORDER — LIDOCAINE 2% (20 MG/ML) 5 ML SYRINGE
INTRAMUSCULAR | Status: AC
  Filled 2018-06-10: qty 5

## 2018-06-10 MED ORDER — PROPOFOL 10 MG/ML IV BOLUS
INTRAVENOUS | Status: DC | PRN
  Administered 2018-06-10: 200 mg via INTRAVENOUS

## 2018-06-10 MED ORDER — ACETAMINOPHEN 160 MG/5ML PO SOLN: 325.0000 mg | ORAL | Status: DC | PRN

## 2018-06-10 MED ORDER — MIDAZOLAM HCL 5 MG/5ML IJ SOLN
INTRAMUSCULAR | Status: DC | PRN
  Administered 2018-06-10: 2 mg via INTRAVENOUS

## 2018-06-10 MED ORDER — OXYCODONE HCL 5 MG PO TABS
5.0000 mg | ORAL_TABLET | ORAL | Status: DC | PRN
  Administered 2018-06-11: 5 mg via ORAL
  Filled 2018-06-10: qty 1

## 2018-06-10 MED ORDER — ACETAMINOPHEN 325 MG PO TABS: 325.0000 mg | ORAL_TABLET | ORAL | Status: DC | PRN

## 2018-06-10 MED ORDER — PRAVASTATIN SODIUM 20 MG PO TABS
10.0000 mg | ORAL_TABLET | Freq: Every evening | ORAL | Status: DC
  Administered 2018-06-10: 10 mg via ORAL
  Filled 2018-06-10: qty 1

## 2018-06-10 MED ORDER — FENTANYL CITRATE (PF) 100 MCG/2ML IJ SOLN
INTRAMUSCULAR | Status: DC | PRN
  Administered 2018-06-10: 100 ug via INTRAVENOUS
  Administered 2018-06-10 (×2): 50 ug via INTRAVENOUS

## 2018-06-10 MED ORDER — ONDANSETRON HCL 4 MG/2ML IJ SOLN: 4.0000 mg | Freq: Once | INTRAMUSCULAR | Status: DC | PRN

## 2018-06-10 MED ORDER — LACTATED RINGERS IV SOLN
INTRAVENOUS | Status: DC
  Administered 2018-06-10: 16:00:00 via INTRAVENOUS

## 2018-06-10 MED ORDER — SODIUM CHLORIDE 0.9 % IV SOLN
INTRAVENOUS | Status: DC | PRN
  Administered 2018-06-10: 500 mL

## 2018-06-10 MED ORDER — SENNA 8.6 MG PO TABS
1.0000 | ORAL_TABLET | Freq: Two times a day (BID) | ORAL | Status: DC
  Administered 2018-06-10: 8.6 mg via ORAL
  Filled 2018-06-10: qty 1

## 2018-06-10 MED ORDER — CEFAZOLIN SODIUM-DEXTROSE 1-4 GM/50ML-% IV SOLN
1.0000 g | Freq: Three times a day (TID) | INTRAVENOUS | Status: DC
  Administered 2018-06-11: 1 g via INTRAVENOUS
  Filled 2018-06-10 (×2): qty 50

## 2018-06-10 MED ORDER — ONDANSETRON HCL 4 MG/2ML IJ SOLN
INTRAMUSCULAR | Status: AC
  Filled 2018-06-10: qty 2

## 2018-06-10 MED ORDER — MEPERIDINE HCL 50 MG/ML IJ SOLN: 6.2500 mg | INTRAMUSCULAR | Status: DC | PRN

## 2018-06-10 MED ORDER — DEXAMETHASONE SODIUM PHOSPHATE 10 MG/ML IJ SOLN
INTRAMUSCULAR | Status: AC
  Filled 2018-06-10: qty 1

## 2018-06-10 MED ORDER — FENTANYL CITRATE (PF) 100 MCG/2ML IJ SOLN
INTRAMUSCULAR | Status: AC
  Administered 2018-06-10: 50 ug via INTRAVENOUS
  Filled 2018-06-10: qty 2

## 2018-06-10 MED ORDER — OXYCODONE HCL 5 MG PO TABS: 5.0000 mg | ORAL_TABLET | Freq: Once | ORAL | Status: DC | PRN

## 2018-06-10 MED ORDER — FENTANYL CITRATE (PF) 100 MCG/2ML IJ SOLN
25.0000 ug | INTRAMUSCULAR | Status: DC | PRN
  Administered 2018-06-10 (×2): 50 ug via INTRAVENOUS

## 2018-06-10 SURGICAL SUPPLY — 32 items
BLADE HEX COATED 2.75 (ELECTRODE) IMPLANT
BNDG GAUZE ELAST 4 BULKY (GAUZE/BANDAGES/DRESSINGS) ×2 IMPLANT
COVER SURGICAL LIGHT HANDLE (MISCELLANEOUS) ×2 IMPLANT
COVER WAND RF STERILE (DRAPES) ×2 IMPLANT
DERMABOND ADVANCED (GAUZE/BANDAGES/DRESSINGS)
DERMABOND ADVANCED .7 DNX12 (GAUZE/BANDAGES/DRESSINGS) IMPLANT
DRAIN PENROSE 18X1/4 LTX STRL (WOUND CARE) ×2 IMPLANT
DRAPE LAPAROTOMY T 98X78 PEDS (DRAPES) ×2 IMPLANT
ELECT REM PT RETURN 15FT ADLT (MISCELLANEOUS) ×2 IMPLANT
GLOVE BIOGEL M 8.0 STRL (GLOVE) ×2 IMPLANT
GLOVE BIOGEL PI IND STRL 7.5 (GLOVE) ×2 IMPLANT
GLOVE BIOGEL PI INDICATOR 7.5 (GLOVE) ×2
GOWN STRL REUS W/ TWL XL LVL3 (GOWN DISPOSABLE) IMPLANT
GOWN STRL REUS W/TWL XL LVL3 (GOWN DISPOSABLE) ×4 IMPLANT
KIT BASIN OR (CUSTOM PROCEDURE TRAY) ×2 IMPLANT
NEEDLE HYPO 22GX1.5 SAFETY (NEEDLE) IMPLANT
NS IRRIG 1000ML POUR BTL (IV SOLUTION) ×2 IMPLANT
PACK GENERAL/GYN (CUSTOM PROCEDURE TRAY) ×2 IMPLANT
SUPPORT SCROTAL LG STRP (MISCELLANEOUS) IMPLANT
SUT CHROMIC 3 0 SH 27 (SUTURE) IMPLANT
SUT CHROMIC 4 0 SH 27 (SUTURE) IMPLANT
SUT ETHILON 3 0 PS 1 (SUTURE) ×2 IMPLANT
SUT MNCRL AB 4-0 PS2 18 (SUTURE) IMPLANT
SUT PROLENE 4 0 RB 1 (SUTURE)
SUT PROLENE 4-0 RB1 .5 CRCL 36 (SUTURE) IMPLANT
SUT VIC AB 2-0 UR5 27 (SUTURE) IMPLANT
SUT VIC AB 4-0 PS2 27 (SUTURE) IMPLANT
SUT VICRYL 0 TIES 12 18 (SUTURE) IMPLANT
SYR CONTROL 10ML LL (SYRINGE) IMPLANT
TOWEL OR 17X26 10 PK STRL BLUE (TOWEL DISPOSABLE) ×2 IMPLANT
TRAY PREP A LATEX SAFE STRL (SET/KITS/TRAYS/PACK) ×2 IMPLANT
WATER STERILE IRR 1000ML POUR (IV SOLUTION) ×2 IMPLANT

## 2018-06-10 NOTE — Transfer of Care (Signed)
Immediate Anesthesia Transfer of Care Note  Patient: Todd BeamDaniel Kofman  Procedure(s) Performed: SCROTUM EXPLORATION AND DRAINAGE OF HEMATOMA (N/A Scrotum)  Patient Location: PACU  Anesthesia Type:General  Level of Consciousness: awake, alert  and oriented  Airway & Oxygen Therapy: Patient Spontanous Breathing and Patient connected to face mask oxygen  Post-op Assessment: Report given to RN and Post -op Vital signs reviewed and stable  Post vital signs: Reviewed and stable  Last Vitals:  Vitals Value Taken Time  BP 130/80 06/10/2018  7:34 PM  Temp    Pulse 93 06/10/2018  7:35 PM  Resp 16 06/10/2018  7:35 PM  SpO2 97 % 06/10/2018  7:35 PM  Vitals shown include unvalidated device data.  Last Pain:  Vitals:   06/10/18 1547  TempSrc:   PainSc: 4       Patients Stated Pain Goal: 6 (06/10/18 1547)  Complications: No apparent anesthesia complications

## 2018-06-10 NOTE — Anesthesia Procedure Notes (Signed)
Procedure Name: LMA Insertion Date/Time: 06/10/2018 6:57 PM Performed by: Hatsumi Steinhart D, CRNA Pre-anesthesia Checklist: Patient identified, Emergency Drugs available, Suction available and Patient being monitored Patient Re-evaluated:Patient Re-evaluated prior to induction Oxygen Delivery Method: Circle system utilized Preoxygenation: Pre-oxygenation with 100% oxygen Induction Type: IV induction Ventilation: Mask ventilation without difficulty LMA: LMA inserted LMA Size: 4.0 Tube type: Oral Number of attempts: 1 Placement Confirmation: positive ETCO2 and breath sounds checked- equal and bilateral Tube secured with: Tape Dental Injury: Teeth and Oropharynx as per pre-operative assessment

## 2018-06-10 NOTE — Op Note (Signed)
Preoperative diagnosis: Status post right hydrocelectomy with postoperative hematoma  Postoperative diagnosis: Same  Procedure: Scrotal exploration, drainage of right hemiscrotal hematoma  Surgeon: Also  Anesthesia: General with LMA  Complications: None  Specimen: Cultures from right hemiscrotum for aerobic and anaerobic  Drains: Quarter inch Penrose in inguinal/scrotal incision  Estimated blood loss: Less than 25 mL  Indications: 32 year old Hispanic male who is 2 weeks out from right hydrocelectomy.  The patient initially had hydrocelectomy 13 years ago, and had a second 1 several years ago other physicians.  He initially did well with his hydrocelectomy but about a week ago developed right hemiscrotal swelling following removal of his drain.  He has had increasing pain and ultrasound today revealed a right-sided hematoma.  Additionally, he has had low-grade fever.  He presents at this time for drainage.  I discussed the procedure with the patient and his mother.  I discussed the fact that we will culture the patient's hematoma, likely place a drain again, and admit him for overnight pain management.  They understand the procedure as well as risks and complications and desire to proceed.  Description of procedure preoperatively, the patient's right side was marked.  He was identified in the holding area and brought to the operating room where general anesthetic was administered.  He is placed in the frog-leg position on the operating table.  Lower abdomen, inguinal and scrotal areas were prepped.  He was draped.  Timeout was performed.  A 3 cm transverse incision in his right anterior mid scrotum was carried out.  This was carried with cautery down to his dartos which was incised.  The hematoma was then entered.  It was evacuated, approximately 150 cc of old blood was evacuated.  The hematoma was cultured prior to evacuation.  The space was then copiously irrigated with antibiotic irrigation.   Additionally, the inguinal incision was opened.  This area communicated with the hematoma.  This area was irrigated as well.  There was no active bleeding.  A single quarter inch Penrose drain was brought through the inguinal incision and out the scrotal incision.  It was sutured at each end to the skin with 3-0 nylon.  Most of the incisions were reapproximated with the same 3-0 nylon in vertical mattress fashion.  There was a 1.5 cm opening in each incision for adequate drainage.  At this point, dry sterile dressings were placed underneath compressive undergarments.  At this point, the procedure was terminated.  The patient was awakened and taken to the PACU in stable condition.  He tolerated the procedure well.

## 2018-06-10 NOTE — Anesthesia Preprocedure Evaluation (Signed)
Anesthesia Evaluation  Patient identified by MRN, date of birth, ID band Patient awake    Reviewed: Allergy & Precautions, H&P , NPO status , Patient's Chart, lab work & pertinent test results, reviewed documented beta blocker date and time   Airway Mallampati: II  TM Distance: >3 FB Neck ROM: Full    Dental no notable dental hx. (+) Teeth Intact, Dental Advisory Given   Pulmonary neg pulmonary ROS, Current Smoker,    Pulmonary exam normal breath sounds clear to auscultation       Cardiovascular Exercise Tolerance: Good negative cardio ROS Normal cardiovascular exam Rhythm:Regular Rate:Normal     Neuro/Psych negative neurological ROS  negative psych ROS   GI/Hepatic negative GI ROS, Neg liver ROS, GERD  ,  Endo/Other  negative endocrine ROS  Renal/GU negative Renal ROS  negative genitourinary   Musculoskeletal negative musculoskeletal ROS (+)   Abdominal   Peds  Hematology negative hematology ROS (+)   Anesthesia Other Findings Right hydrocele  Reproductive/Obstetrics negative OB ROS                             Anesthesia Physical  Anesthesia Plan  ASA: II  Anesthesia Plan: General   Post-op Pain Management:    Induction: Intravenous  PONV Risk Score and Plan: 1 and Ondansetron, Dexamethasone and Midazolam  Airway Management Planned: LMA  Additional Equipment:   Intra-op Plan:   Post-operative Plan: Extubation in OR  Informed Consent: I have reviewed the patients History and Physical, chart, labs and discussed the procedure including the risks, benefits and alternatives for the proposed anesthesia with the patient or authorized representative who has indicated his/her understanding and acceptance.   Dental advisory given  Plan Discussed with: CRNA, Anesthesiologist and Surgeon  Anesthesia Plan Comments:         Anesthesia Quick Evaluation

## 2018-06-10 NOTE — H&P (Signed)
H&P  Chief Complaint:  Right scrotal swelling  History of Present Illness:  32 year old male status post redo hydrocelectomy through the inguinal approach about 2 weeks ago.  He presented earlier today for the 2nd time in a week with right scrotal swelling and pain.  He does have a hematoma based on ultrasound.  He presents at this time for right scrotal exploration and drainage of hematoma / drain placement.  Past Medical History:  Diagnosis Date  . GERD (gastroesophageal reflux disease)   . Hyperlipidemia   . Right hydrocele     Past Surgical History:  Procedure Laterality Date  . HYDROCELE EXCISION  age 32  . HYDROCELE EXCISION Right 05/24/2018   Procedure: HYDROCELECTOMY ADULT;  Surgeon: Marcine Matarahlstedt, Safire Gordin, MD;  [facility];  Service: Urology;  Laterality: Right;  . HYDROCELECTOMY Right 06-08-2017;   08-25-2009 (@[facility] )  . ORIF FIBULA FRACTURE Left 06/25/2015   Procedure: LEFT OPEN REDUCTION INTERNAL FIXATION (ORIF) DISTAL FIBULA LATERAL MALLEOLUS WITH SYNDESMOSIS ;  Surgeon: Frederico Hammananiel Caffrey, MD;  Location: [facility];  Service: Orthopedics;  Laterality: Left;    Home Medications:   Keflex   Allergies: No Known Allergies  History reviewed. No pertinent family history.  Social History:  reports that he has been smoking cigarettes. He has smoked for the past 5.00 years. He has never used smokeless tobacco. He reports current alcohol use. He reports that he does not use drugs.  ROS: A complete review of systems was performed.  All systems are negative except for pertinent findings as noted.  Physical Exam:  Vital signs in last 24 hours: Temp:  [98.1 F (36.7 C)] 98.1 F (36.7 C) (12/23 1502) Pulse Rate:  [103] 103 (12/23 1502) Resp:  [16] 16 (12/23 1502) BP: (131)/(88) 131/88 (12/23 1502) SpO2:  [100 %] 100 % (12/23 1502) Weight:  [93.6 kg] 93.6 kg (12/23 1502) Constitutional:  Alert and oriented, No acute distress Cardiovascular:  Regular rate  Respiratory: Normal respiratory effort GI: Abdomen is soft, nontender, nondistended, no abdominal masses. No CVAT.  Genitourinary: Normal male phallus,  Significantly  Swollen right hemiscrotum, palpable left testicle but nonpalpable right testicle. Lymphatic: No lymphadenopathy Neurologic: Grossly intact, no focal deficits Psychiatric: Normal mood and affect  Laboratory Data:  No results for input(s): WBC, HGB, HCT, PLT in the last 72 hours.  No results for input(s): NA, K, CL, GLUCOSE, BUN, CALCIUM, CREATININE in the last 72 hours.  Invalid input(s): CO3   No results found for this or any previous visit (from the past 24 hour(s)). No results found for this or any previous visit (from the past 240 hour(s)).  Renal Function: No results for input(s): CREATININE in the last 168 hours. CrCl cannot be calculated (Patient's most recent lab result is older than the maximum 21 days allowed.).  Radiologic Imaging: No results found.  Impression/Assessment:    Right sided hematoma following hydrocelectomy  Plan:   right scrotal exploration, drainage of hematoma

## 2018-06-11 ENCOUNTER — Encounter (HOSPITAL_COMMUNITY): Payer: Self-pay | Admitting: Urology

## 2018-06-11 DIAGNOSIS — S3022XA Contusion of scrotum and testes, initial encounter: Secondary | ICD-10-CM | POA: Diagnosis not present

## 2018-06-11 DIAGNOSIS — N5089 Other specified disorders of the male genital organs: Secondary | ICD-10-CM | POA: Diagnosis not present

## 2018-06-11 DIAGNOSIS — K219 Gastro-esophageal reflux disease without esophagitis: Secondary | ICD-10-CM | POA: Diagnosis not present

## 2018-06-11 DIAGNOSIS — Z79899 Other long term (current) drug therapy: Secondary | ICD-10-CM | POA: Diagnosis not present

## 2018-06-11 DIAGNOSIS — X58XXXA Exposure to other specified factors, initial encounter: Secondary | ICD-10-CM | POA: Diagnosis not present

## 2018-06-11 DIAGNOSIS — E785 Hyperlipidemia, unspecified: Secondary | ICD-10-CM | POA: Diagnosis not present

## 2018-06-11 DIAGNOSIS — F17210 Nicotine dependence, cigarettes, uncomplicated: Secondary | ICD-10-CM | POA: Diagnosis not present

## 2018-06-11 LAB — CREATININE, SERUM
Creatinine, Ser: 0.81 mg/dL (ref 0.61–1.24)
GFR calc Af Amer: >60 mL/min (ref >60)
GFR calc non Af Amer: >60 mL/min (ref >60)

## 2018-06-11 LAB — HIV ANTIBODY (ROUTINE TESTING W REFLEX): HIV Screen 4th Generation wRfx: NONREACTIVE

## 2018-06-11 NOTE — Discharge Summary (Signed)
Date of admission: 06/10/2018  Date of discharge: 06/11/2018  Admission diagnosis: scrotal hematoma  Discharge diagnosis: same  Secondary diagnoses:  Patient Active Problem List   Diagnosis Date Noted  . Scrotal hematoma 06/10/2018    Procedures performed: Procedure(s): SCROTUM EXPLORATION AND DRAINAGE OF HEMATOMA  History and Physical: For full details, please see admission history and physical. Briefly, Todd Wong is a 32 y.o. year old patient with bleeding after hydrocele repair.   Hospital Course: Patient tolerated the procedure well.  He was then transferred to the floor after an uneventful PACU stay.  His hospital course was uncomplicated.  On POD#1 he had met discharge criteria: was eating a regular diet, was up and ambulating independently,  pain was well controlled, was voiding without a catheter, and was ready to for discharge.  Vitals:   06/10/18 2224 06/10/18 2237 06/10/18 2325 06/11/18 0500  BP: 128/76 122/71 128/71 111/69  Pulse: 91 93 91 79  Resp: _0 Temp: 98.6 F (37 C) 98.5 F (36.9 C) 97.8 F (36.6 C) (!) 97.5 F (36.4 C)  TempSrc: Oral Oral Oral Oral  SpO2: 98% 100% 99% 98%  Weight:      Height:       NAD  Intake/Output Summary (Last 24 hours) at 06/11/2018 0710 Last data filed at 06/11/2018 0600 Gross per 24 hour  Intake 1737.27 ml  Output 1100 ml  Net 637.27 ml   Non-labored breathing Scrotal wound clean, drains active, gauze stained with sanguinous fluids  Laboratory values:  No results for input(s): WBC, HGB, HCT in the last 72 hours. Recent Labs    06/11/18 0432  CREATININE 0.81   No results for input(s): LABPT, INR in the last 72 hours. No results for input(s): LABURIN in the last 72 hours. No results found for this or any previous visit.  Disposition: Home  Discharge instruction: The patient was instructed to be ambulatory but told to refrain from heavy lifting, strenuous activity, or driving.   Discharge  medications:  Allergies as of 06/11/2018   No Known Allergies     Medication List    TAKE these medications   fenofibrate 160 MG tablet Take 160 mg by mouth every evening.   IRON PO Take 1 tablet by mouth daily. OTC   Omega-3 1000 MG Caps Take 1 capsule by mouth daily.   omeprazole 20 MG capsule Commonly known as:  PRILOSEC Take 20 mg by mouth daily.   pravastatin 10 MG tablet Commonly known as:  PRAVACHOL Take 10 mg by mouth every evening.   traMADol 50 MG tablet Commonly known as:  ULTRAM Take 1 tablet (50 mg total) by mouth every 6 (six) hours as needed. What changed:  reasons to take this       Followup:  Follow-up Information    Karen Kays, NP.   Specialty:  Nurse Practitioner Why:  12/27 @ 8:15 am Contact information: [address] [place] [address] [phone]

## 2018-06-11 NOTE — Discharge Instructions (Signed)
HOME CARE INSTRUCTIONS FOR SCROTAL PROCEDURES  Wound Care & Hygiene: You may apply an ice bag to the scrotum for the first 24 hours.  This may help decrease swelling and soreness.  You may have a dressing held in place by an athletic supporter.  You may remove the dressing in 24 hours and shower in 48 hours.  Continue to use the athletic supporter or tight briefs for at least a week.  Activity: Rest today - not necessarily flat bed rest.  Just take it easy.  You should not do strenuous activities until your follow-up visit with your doctor.  You may resume light activity in 48 hours.  Return to Work:  Your doctor will advise you of this depending on the type of work you do  Diet: Drink liquids or eat a light diet this evening.  You may resume a regular diet tomorrow.  General Expectations: You may have a small amount of bleeding.  The scrotum may be swollen or bruised for about a week.  Call your Doctor if these occur:  -persistent or heavy bleeding  -temperature of 101 degrees or more  -severe pain, not relieved by your pain medication  Medications:             - Continue to take the Cephalexin (antibiotic as prescribed) until it is gone             - Take the pain medications for break through pain, may use Ibuprofen 800mg  every 8 hours as needed  [facility]:  Call to set up and appointment.  Patient Signature:  __________________________________________________  Nurse's Signature:  __________________________________________________

## 2018-06-12 NOTE — Anesthesia Postprocedure Evaluation (Signed)
Anesthesia Post Note  Patient: Todd Wong  Procedure(s) Performed: SCROTUM EXPLORATION AND DRAINAGE OF HEMATOMA (N/A Scrotum)     Patient location during evaluation: PACU Anesthesia Type: General Level of consciousness: awake and alert Pain management: pain level controlled Vital Signs Assessment: post-procedure vital signs reviewed and stable Respiratory status: spontaneous breathing, nonlabored ventilation, respiratory function stable and patient connected to nasal cannula oxygen Cardiovascular status: blood pressure returned to baseline and stable Postop Assessment: no apparent nausea or vomiting Anesthetic complications: no    Last Vitals:  Vitals:   06/10/18 2325 06/11/18 0500  BP: 128/71 111/69  Pulse: 91 79  Resp: 17 18  Temp: 36.6 C (!) 36.4 C  SpO2: 99% 98%    Last Pain:  Vitals:   06/11/18 0812  TempSrc:   PainSc: 0-No pain                 Prather Failla

## 2018-06-14 DIAGNOSIS — N492 Inflammatory disorders of scrotum: Secondary | ICD-10-CM | POA: Diagnosis not present

## 2018-06-16 LAB — AEROBIC/ANAEROBIC CULTURE W GRAM STAIN (SURGICAL/DEEP WOUND): Gram Stain: NONE SEEN

## 2018-06-17 DIAGNOSIS — N430 Encysted hydrocele: Secondary | ICD-10-CM | POA: Diagnosis not present

## 2018-06-17 DIAGNOSIS — N5082 Scrotal pain: Secondary | ICD-10-CM | POA: Diagnosis not present

## 2018-06-17 DIAGNOSIS — N492 Inflammatory disorders of scrotum: Secondary | ICD-10-CM | POA: Diagnosis not present

## 2018-06-21 DIAGNOSIS — N492 Inflammatory disorders of scrotum: Secondary | ICD-10-CM | POA: Diagnosis not present

## 2018-06-21 DIAGNOSIS — N430 Encysted hydrocele: Secondary | ICD-10-CM | POA: Diagnosis not present

## 2018-06-24 DIAGNOSIS — N430 Encysted hydrocele: Secondary | ICD-10-CM | POA: Diagnosis not present

## 2018-06-24 DIAGNOSIS — N492 Inflammatory disorders of scrotum: Secondary | ICD-10-CM | POA: Diagnosis not present

## 2018-06-28 DIAGNOSIS — N430 Encysted hydrocele: Secondary | ICD-10-CM | POA: Diagnosis not present

## 2018-07-05 DIAGNOSIS — N5082 Scrotal pain: Secondary | ICD-10-CM | POA: Diagnosis not present

## 2018-07-05 DIAGNOSIS — N430 Encysted hydrocele: Secondary | ICD-10-CM | POA: Diagnosis not present

## 2018-07-05 DIAGNOSIS — N492 Inflammatory disorders of scrotum: Secondary | ICD-10-CM | POA: Diagnosis not present

## 2018-08-09 DIAGNOSIS — N5082 Scrotal pain: Secondary | ICD-10-CM | POA: Diagnosis not present

## 2018-08-21 IMAGING — NM NM HEPATO W/GB/PHARM/[PERSON_NAME]
1 series · 12 of 12 positions shown · non-contrast
Comparison: Abdominal ultrasound dated July 11, 2016.

CLINICAL DATA: Two weeks of right upper quadrant pain with nausea
and vomiting. History of gastric ulcers.

EXAM:
NUCLEAR MEDICINE HEPATOBILIARY IMAGING WITH GALLBLADDER EF
TECHNIQUE: Sequential images of the abdomen were obtained [DATE] minutes
following intravenous administration of radiopharmaceutical. After
slow intravenous infusion of 1.8 micrograms Cholecystokinin,
gallbladder ejection fraction was determined.
RADIOPHARMACEUTICALS:  5.2 mCi Fc-UUm Choletec IV

[Series 1: hepato · 4.46mm/px · 2 acquisitions, 12 frames shown]
[im 1/2]
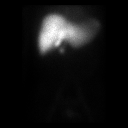
[im 1/2]
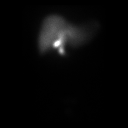
[im 1/2]
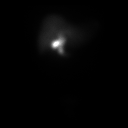
[im 1/2]
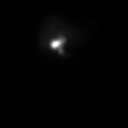
[im 1/2]
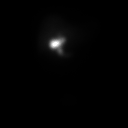
[im 1/2]
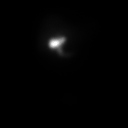
[im 2/2]
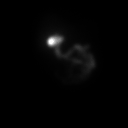
[im 2/2]
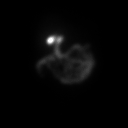
[im 2/2]
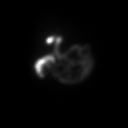
[im 2/2]
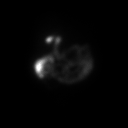
[im 2/2]
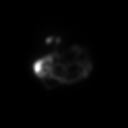
[im 2/2]
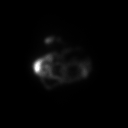

[12 of 12 positions shown; findings below may reference images not displayed]

FINDINGS: Prompt uptake and biliary excretion of activity by the liver is
seen. Gallbladder activity is visualized, consistent with patency of
cystic duct. Biliary activity passes into small bowel, consistent
with patent common bile duct.

Calculated gallbladder ejection fraction is 83%. (At 60 min, normal
ejection fraction is greater than 40%.)
IMPRESSION: Normal hepatobiliary scan with normal gallbladder ejection fraction.

## 2018-09-18 DIAGNOSIS — Z0000 Encounter for general adult medical examination without abnormal findings: Secondary | ICD-10-CM | POA: Diagnosis not present

## 2018-09-18 DIAGNOSIS — Z125 Encounter for screening for malignant neoplasm of prostate: Secondary | ICD-10-CM | POA: Diagnosis not present

## 2018-09-25 DIAGNOSIS — K760 Fatty (change of) liver, not elsewhere classified: Secondary | ICD-10-CM | POA: Diagnosis not present

## 2018-09-25 DIAGNOSIS — E781 Pure hyperglyceridemia: Secondary | ICD-10-CM | POA: Diagnosis not present

## 2018-09-25 DIAGNOSIS — Z0000 Encounter for general adult medical examination without abnormal findings: Secondary | ICD-10-CM | POA: Diagnosis not present

## 2018-09-25 DIAGNOSIS — K219 Gastro-esophageal reflux disease without esophagitis: Secondary | ICD-10-CM | POA: Diagnosis not present

## 2018-11-06 DIAGNOSIS — N430 Encysted hydrocele: Secondary | ICD-10-CM | POA: Diagnosis not present

## 2018-11-06 DIAGNOSIS — N492 Inflammatory disorders of scrotum: Secondary | ICD-10-CM | POA: Diagnosis not present

## 2018-11-26 DIAGNOSIS — U071 COVID-19: Secondary | ICD-10-CM | POA: Diagnosis not present

## 2018-11-26 DIAGNOSIS — J069 Acute upper respiratory infection, unspecified: Secondary | ICD-10-CM | POA: Diagnosis not present

## 2018-12-10 DIAGNOSIS — U071 COVID-19: Secondary | ICD-10-CM | POA: Diagnosis not present

## 2018-12-19 DIAGNOSIS — U071 COVID-19: Secondary | ICD-10-CM | POA: Diagnosis not present

## 2018-12-30 DIAGNOSIS — Z2089 Contact with and (suspected) exposure to other communicable diseases: Secondary | ICD-10-CM | POA: Diagnosis not present

## 2018-12-30 DIAGNOSIS — U071 COVID-19: Secondary | ICD-10-CM | POA: Diagnosis not present

## 2019-01-06 DIAGNOSIS — U071 COVID-19: Secondary | ICD-10-CM | POA: Diagnosis not present

## 2019-01-30 DIAGNOSIS — R079 Chest pain, unspecified: Secondary | ICD-10-CM | POA: Diagnosis not present

## 2019-01-30 DIAGNOSIS — R002 Palpitations: Secondary | ICD-10-CM | POA: Diagnosis not present

## 2019-02-21 DIAGNOSIS — R002 Palpitations: Secondary | ICD-10-CM | POA: Diagnosis not present

## 2019-02-21 DIAGNOSIS — R079 Chest pain, unspecified: Secondary | ICD-10-CM | POA: Diagnosis not present

## 2019-02-27 DIAGNOSIS — R079 Chest pain, unspecified: Secondary | ICD-10-CM | POA: Diagnosis not present

## 2019-03-25 DIAGNOSIS — K760 Fatty (change of) liver, not elsewhere classified: Secondary | ICD-10-CM | POA: Diagnosis not present

## 2019-03-25 DIAGNOSIS — Z0184 Encounter for antibody response examination: Secondary | ICD-10-CM | POA: Diagnosis not present

## 2019-03-25 DIAGNOSIS — K219 Gastro-esophageal reflux disease without esophagitis: Secondary | ICD-10-CM | POA: Diagnosis not present

## 2019-03-25 DIAGNOSIS — R7301 Impaired fasting glucose: Secondary | ICD-10-CM | POA: Diagnosis not present

## 2019-03-25 DIAGNOSIS — E781 Pure hyperglyceridemia: Secondary | ICD-10-CM | POA: Diagnosis not present

## 2019-04-01 DIAGNOSIS — G4733 Obstructive sleep apnea (adult) (pediatric): Secondary | ICD-10-CM | POA: Diagnosis not present

## 2019-04-01 DIAGNOSIS — K5900 Constipation, unspecified: Secondary | ICD-10-CM | POA: Diagnosis not present

## 2019-04-01 DIAGNOSIS — K760 Fatty (change of) liver, not elsewhere classified: Secondary | ICD-10-CM | POA: Diagnosis not present

## 2019-04-01 DIAGNOSIS — E781 Pure hyperglyceridemia: Secondary | ICD-10-CM | POA: Diagnosis not present

## 2019-04-01 DIAGNOSIS — Z23 Encounter for immunization: Secondary | ICD-10-CM | POA: Diagnosis not present

## 2019-07-29 DIAGNOSIS — K760 Fatty (change of) liver, not elsewhere classified: Secondary | ICD-10-CM | POA: Diagnosis not present

## 2019-07-29 DIAGNOSIS — R946 Abnormal results of thyroid function studies: Secondary | ICD-10-CM | POA: Diagnosis not present

## 2019-07-29 DIAGNOSIS — R7301 Impaired fasting glucose: Secondary | ICD-10-CM | POA: Diagnosis not present

## 2019-07-29 DIAGNOSIS — E781 Pure hyperglyceridemia: Secondary | ICD-10-CM | POA: Diagnosis not present

## 2019-08-05 DIAGNOSIS — F4321 Adjustment disorder with depressed mood: Secondary | ICD-10-CM | POA: Diagnosis not present

## 2019-08-05 DIAGNOSIS — I10 Essential (primary) hypertension: Secondary | ICD-10-CM | POA: Diagnosis not present

## 2019-08-05 DIAGNOSIS — R7989 Other specified abnormal findings of blood chemistry: Secondary | ICD-10-CM | POA: Diagnosis not present

## 2019-08-05 DIAGNOSIS — E782 Mixed hyperlipidemia: Secondary | ICD-10-CM | POA: Diagnosis not present

## 2019-12-02 DIAGNOSIS — R7309 Other abnormal glucose: Secondary | ICD-10-CM | POA: Diagnosis not present

## 2019-12-02 DIAGNOSIS — R7989 Other specified abnormal findings of blood chemistry: Secondary | ICD-10-CM | POA: Diagnosis not present

## 2019-12-02 DIAGNOSIS — E782 Mixed hyperlipidemia: Secondary | ICD-10-CM | POA: Diagnosis not present

## 2019-12-09 DIAGNOSIS — Z0000 Encounter for general adult medical examination without abnormal findings: Secondary | ICD-10-CM | POA: Diagnosis not present

## 2019-12-09 DIAGNOSIS — E7800 Pure hypercholesterolemia, unspecified: Secondary | ICD-10-CM | POA: Diagnosis not present

## 2020-05-25 DIAGNOSIS — K760 Fatty (change of) liver, not elsewhere classified: Secondary | ICD-10-CM | POA: Diagnosis not present

## 2020-05-25 DIAGNOSIS — E781 Pure hyperglyceridemia: Secondary | ICD-10-CM | POA: Diagnosis not present

## 2020-06-01 DIAGNOSIS — I10 Essential (primary) hypertension: Secondary | ICD-10-CM | POA: Diagnosis not present

## 2020-06-01 DIAGNOSIS — K219 Gastro-esophageal reflux disease without esophagitis: Secondary | ICD-10-CM | POA: Diagnosis not present

## 2020-06-01 DIAGNOSIS — K760 Fatty (change of) liver, not elsewhere classified: Secondary | ICD-10-CM | POA: Diagnosis not present

## 2020-06-01 DIAGNOSIS — E781 Pure hyperglyceridemia: Secondary | ICD-10-CM | POA: Diagnosis not present

## 2020-10-16 ENCOUNTER — Emergency Department (HOSPITAL_COMMUNITY)
Admission: EM | Admit: 2020-10-16 | Discharge: 2020-10-17 | Disposition: A | Discharge status: Home / Self Care | Payer: BC Managed Care – PPO | Attending: Emergency Medicine | Admitting: Emergency Medicine

## 2020-10-16 ENCOUNTER — Encounter (HOSPITAL_COMMUNITY): Payer: Self-pay

## 2020-10-16 ENCOUNTER — Other Ambulatory Visit: Payer: Self-pay

## 2020-10-16 DIAGNOSIS — K219 Gastro-esophageal reflux disease without esophagitis: Secondary | ICD-10-CM | POA: Diagnosis not present

## 2020-10-16 DIAGNOSIS — R109 Unspecified abdominal pain: Secondary | ICD-10-CM | POA: Diagnosis not present

## 2020-10-16 DIAGNOSIS — K659 Peritonitis, unspecified: Secondary | ICD-10-CM | POA: Diagnosis not present

## 2020-10-16 DIAGNOSIS — R7989 Other specified abnormal findings of blood chemistry: Secondary | ICD-10-CM

## 2020-10-16 DIAGNOSIS — K760 Fatty (change of) liver, not elsewhere classified: Secondary | ICD-10-CM | POA: Diagnosis not present

## 2020-10-16 DIAGNOSIS — R7401 Elevation of levels of liver transaminase levels: Secondary | ICD-10-CM | POA: Diagnosis not present

## 2020-10-16 DIAGNOSIS — F17210 Nicotine dependence, cigarettes, uncomplicated: Secondary | ICD-10-CM | POA: Diagnosis not present

## 2020-10-16 DIAGNOSIS — K6389 Other specified diseases of intestine: Secondary | ICD-10-CM

## 2020-10-16 DIAGNOSIS — R1110 Vomiting, unspecified: Secondary | ICD-10-CM | POA: Diagnosis not present

## 2020-10-16 DIAGNOSIS — K388 Other specified diseases of appendix: Secondary | ICD-10-CM | POA: Diagnosis not present

## 2020-10-16 LAB — URINALYSIS, ROUTINE W REFLEX MICROSCOPIC
Bilirubin Urine: NEGATIVE
Glucose, UA: NEGATIVE mg/dL
Hgb urine dipstick: NEGATIVE
Ketones, ur: NEGATIVE mg/dL
Leukocytes,Ua: NEGATIVE
Nitrite: NEGATIVE
Protein, ur: NEGATIVE mg/dL
Specific Gravity, Urine: 1.004 — ABNORMAL LOW (ref 1.005–1.030)
pH: 9 — ABNORMAL HIGH (ref 5.0–8.0)

## 2020-10-16 LAB — COMPREHENSIVE METABOLIC PANEL
ALT: 98 U/L — ABNORMAL HIGH (ref 0–44)
AST: 46 U/L — ABNORMAL HIGH (ref 15–41)
Albumin: 4.4 g/dL (ref 3.5–5.0)
Alkaline Phosphatase: 70 U/L (ref 38–126)
Anion gap: 7 (ref 5–15)
BUN: 10 mg/dL (ref 6–20)
CO2: 23 mmol/L (ref 22–32)
Calcium: 9.1 mg/dL (ref 8.9–10.3)
Chloride: 107 mmol/L (ref 98–111)
Creatinine, Ser: 0.9 mg/dL (ref 0.61–1.24)
GFR, Estimated: >60 mL/min (ref >60)
Glucose, Bld: 111 mg/dL — ABNORMAL HIGH (ref 70–99)
Potassium: 3.7 mmol/L (ref 3.5–5.1)
Sodium: 137 mmol/L (ref 135–145)
Total Bilirubin: 0.6 mg/dL (ref 0.3–1.2)
Total Protein: 7.2 g/dL (ref 6.5–8.1)

## 2020-10-16 LAB — CBC
HCT: 40.6 % (ref 39.0–52.0)
Hemoglobin: 13.4 g/dL (ref 13.0–17.0)
MCH: 30 pg (ref 26.0–34.0)
MCHC: 33 g/dL (ref 30.0–36.0)
MCV: 90.8 fL (ref 80.0–100.0)
Platelets: 354 10*3/uL (ref 150–400)
RBC: 4.47 MIL/uL (ref 4.22–5.81)
RDW: 12.5 % (ref 11.5–15.5)
WBC: 8.6 10*3/uL (ref 4.0–10.5)
nRBC: 0 % (ref 0.0–0.2)

## 2020-10-16 LAB — LIPASE, BLOOD: Lipase: 26 U/L (ref 11–51)

## 2020-10-16 MED ORDER — MORPHINE SULFATE (PF) 4 MG/ML IV SOLN
4.0000 mg | Freq: Once | INTRAVENOUS | Status: AC
Start: 2020-10-17 — End: 2020-10-17
  Administered 2020-10-17: 4 mg via INTRAVENOUS
  Filled 2020-10-16: qty 1

## 2020-10-16 MED ORDER — SODIUM CHLORIDE 0.9 % IV BOLUS
1000.0000 mL | Freq: Once | INTRAVENOUS | Status: AC
  Administered 2020-10-17: 1000 mL via INTRAVENOUS

## 2020-10-16 MED ORDER — ONDANSETRON HCL 4 MG/2ML IJ SOLN
4.0000 mg | Freq: Once | INTRAMUSCULAR | Status: AC
  Administered 2020-10-17: 4 mg via INTRAVENOUS
  Filled 2020-10-16: qty 2

## 2020-10-16 NOTE — ED Provider Notes (Signed)
[facility] EMERGENCY DEPARTMENT Provider Note   CSN: [id number] Arrival date & time: 10/16/20  2252     History Chief Complaint  Patient presents with  . Abdominal Pain    Todd Wong is a 35 y.o. male with a history of GERD, hyperlipidemia, as well as hydrocele with multiple prior testicular surgeries who presents to the emergency department with complaints of abdominal pain for the past 3 days. Patient states pain is located to the right side of the abdomen (RUQ/RLQ), progressively worsening, no alleviating/aggravating factors. Today has had associated sweats/chills & nausea with 2 episodes of emesis. Has had some loose stools this week but not what he would classify as diarrhea. Has chronic testicular problems without acute pain/swelling. Denies hematemesis, melena, hematochezia, dysuria, hematuria, chest pain, dyspnea, or cough.   Interpretor utilized throughout Audiological scientist.   HPI     Past Medical History:  Diagnosis Date  . GERD (gastroesophageal reflux disease)   . Hyperlipidemia   . Right hydrocele     Patient Active Problem List   Diagnosis Date Noted  . Scrotal hematoma 06/10/2018    Past Surgical History:  Procedure Laterality Date  . HYDROCELE EXCISION  age 14  . HYDROCELE EXCISION Right 05/24/2018   Procedure: HYDROCELECTOMY ADULT;  Surgeon: Marcine Matar, MD;  Location: [facility];  Service: Urology;  Laterality: Right;  . HYDROCELECTOMY Right 06-08-2017;   08-25-2009 (@[facility] )  . ORIF FIBULA FRACTURE Left 06/25/2015   Procedure: LEFT OPEN REDUCTION INTERNAL FIXATION (ORIF) DISTAL FIBULA LATERAL MALLEOLUS WITH SYNDESMOSIS ;  Surgeon: 08/23/2015, MD;  Location: [facility];  Service: Orthopedics;  Laterality: Left;  . SCROTAL EXPLORATION N/A 06/10/2018   Procedure: SCROTUM EXPLORATION AND DRAINAGE OF HEMATOMA;  Surgeon: 06/12/2018, MD;  Location: [facility];  Service: Urology;  Laterality: N/A;        History reviewed. No pertinent family history.  Social History   Tobacco Use  . Smoking status: Current Some Day Smoker    Years: 5.00    Types: Cigarettes  . Smokeless tobacco: Never Used  . Tobacco comment: 1 ppmonth  Substance Use Topics  . Alcohol use: Yes    Comment: occasional  . Drug use: Never    Home Medications Prior to Admission medications   Medication Sig Start Date End Date Taking? Authorizing Provider  acetaminophen (TYLENOL) 500 MG tablet Take 1,000 mg by mouth every 6 (six) hours as needed for moderate pain or headache.   Yes [provider]  bisoprolol (ZEBETA) 5 MG tablet Take 5 mg by mouth 2 (two) times daily. 07/29/20  Yes [provider]  Cholecalciferol (VITAMIN D3) 250 MCG (10000 UT) TABS Take 1,000 Units by mouth daily.   Yes [provider]  CINNAMON PO Take 1 capsule by mouth daily.   Yes [provider]  fenofibrate 160 MG tablet Take 160 mg by mouth every evening.  06/10/15  Yes [provider]  IRON PO Take 1 tablet by mouth daily. OTC   Yes [provider]  pantoprazole (PROTONIX) 40 MG tablet Take 40 mg by mouth daily. 07/31/20  Yes [provider]  pravastatin (PRAVACHOL) 40 MG tablet Take 40 mg by mouth every evening. 06/07/20  Yes [provider]  VASCEPA 1 g capsule Take 2 g by mouth 2 (two) times daily. 10/03/20  Yes [provider]  vitamin E (VITAMIN E) 180 MG (400 UNITS) capsule Take 400 Units by mouth daily.   Yes [provider]    Allergies    Patient has no known allergies.  Review of Systems   Review of Systems  Constitutional: Positive for chills (chills/sweats) and fever (subjective).  Respiratory: Negative for cough and shortness of breath.   Cardiovascular: Negative for chest pain.  Gastrointestinal: Positive for abdominal pain, diarrhea (soft stool), nausea and vomiting. Negative for anal bleeding, blood in stool and constipation.   Genitourinary: Negative for dysuria and frequency.       Patient reports chronic testicular issues without acute change.  All other systems reviewed and are negative.   Physical Exam Updated Vital Signs BP 136/81 (BP Location: Right Arm)   Pulse 64   Temp 98.2 F (36.8 C) (Oral)   Resp 17   Ht 5\' 7"  (1.702 m)   Wt 104.3 kg   SpO2 100%   BMI 36.02 kg/m   Physical Exam Vitals and nursing note reviewed.  Constitutional:      General: He is not in acute distress.    Appearance: He is well-developed. He is not toxic-appearing.  HENT:     Head: Normocephalic and atraumatic.  Eyes:     General:        Right eye: No discharge.        Left eye: No discharge.     Conjunctiva/sclera: Conjunctivae normal.  Cardiovascular:     Rate and Rhythm: Normal rate and regular rhythm.  Pulmonary:     Effort: Pulmonary effort is normal. No respiratory distress.     Breath sounds: Normal breath sounds. No wheezing, rhonchi or rales.  Abdominal:     General: There is no distension.     Palpations: Abdomen is soft.     Tenderness: There is abdominal tenderness in the right upper quadrant and right lower quadrant. There is no guarding or rebound.  Genitourinary:    Comments: Deferred.  Musculoskeletal:     Cervical back: Neck supple.  Skin:    General: Skin is warm and dry.     Findings: No rash.  Neurological:     Mental Status: He is alert.     Comments: Clear speech.   Psychiatric:        Behavior: Behavior normal.     ED Results / Procedures / Treatments   Labs (all labs ordered are listed, but only abnormal results are displayed) Labs Reviewed  COMPREHENSIVE METABOLIC PANEL - Abnormal; Notable for the following components:      Result Value   Glucose, Bld 111 (*)    AST 46 (*)    ALT 98 (*)    All other components within normal limits  URINALYSIS, ROUTINE W REFLEX MICROSCOPIC - Abnormal; Notable for the following components:   Color, Urine STRAW (*)    Specific Gravity,  Urine 1.004 (*)    pH 9.0 (*)    All other components within normal limits  LIPASE, BLOOD  CBC    EKG None  Radiology CT Abdomen Pelvis W Contrast  Result Date: 10/17/2020 CLINICAL DATA:  Right lower quadrant abdominal pain for 2-3 days, initially with nausea and vomiting since resolved. EXAM: CT ABDOMEN AND PELVIS WITH CONTRAST TECHNIQUE: Multidetector CT imaging of the abdomen and pelvis was performed using the standard protocol following bolus administration of intravenous contrast. CONTRAST:  100mL OMNIPAQUE IOHEXOL 300 MG/ML  SOLN COMPARISON:  HIDA 07/11/2016, scrotal ultrasound 02/27/2017 FINDINGS: Lower chest: Atelectatic changes in the otherwise clear lung bases. Normal heart size. No pericardial effusion. Hepatobiliary: Diffuse hepatic hypoattenuation compatible with  hepatic steatosis. Sparing along the gallbladder fossa. Smooth surface contour. No concerning focal liver lesion. Portal and hepatic veins appear widely patent. Normal gallbladder and biliary tree without visible calcified gallstone. Pancreas: No pancreatic ductal dilatation or surrounding inflammatory changes. Spleen: Normal in size. No concerning splenic lesions. Adrenals/Urinary Tract: Normal adrenal glands. Kidneys are normally located with symmetric enhancementand excretion. Small fluid attenuation cyst in the right kidney measures up to 1.8 cm (8/16). No suspicious renal lesion, urolithiasis or hydronephrosis. Circumferential bladder wall thickening may be related to underdistention though could correlate for urinary symptoms. Stomach/Bowel: Distal esophagus, stomach and duodenum are unremarkable. No small bowel thickening or dilatation. Small, normal appearing air-filled appendix arising from the cecal tip. Circumscribed fat attenuation adjacent the anti mesenteric wall of the ascending colon has an appearance suggestive of an inflamed epiploic appendage (3/52, 6/43). No colonic thickening or dilatation. Vascular/Lymphatic:  No significant vascular findings are present. No enlarged abdominal or pelvic lymph nodes. Reproductive: The prostate and seminal vesicles are unremarkable. Postsurgical changes of the right scrotum/inguinal canal. Other: Small fat containing no hernias, right slightly larger than left. No bowel containing hernia. No abdominopelvic free air or fluid. Inflammatory changes centered upon an epiploic appendage in the right hemiabdomen, as detailed above. Musculoskeletal: No acute osseous or soft tissue abnormality. IMPRESSION: Appearance suggestive of an epiploic appendagitis along the ascending colon in the right hemiabdomen. Normal appendix. Mild bladder wall thickening, likely related to underdistention though could correlate for urinalysis. Postsurgical changes of the right scrotum and inguinal canal, may correlate with surgical history prior scrotal hematoma evacuation and hydrocelectomy. Small fat containing bilateral inguinal hernias. Hepatic steatosis. Electronically Signed   By: Kreg Shropshire M.D.   On: 10/17/2020 01:07    Procedures Procedures   Medications Ordered in ED Medications  sodium chloride 0.9 % bolus 1,000 mL (0 mLs Intravenous Stopped 10/17/20 0250)  ondansetron (ZOFRAN) injection 4 mg (4 mg Intravenous Given 10/17/20 0012)  morphine 4 MG/ML injection 4 mg (4 mg Intravenous Given 10/17/20 0010)  iohexol (OMNIPAQUE) 300 MG/ML solution 100 mL (100 mLs Intravenous Contrast Given 10/17/20 0039)  ketorolac (TORADOL) 15 MG/ML injection 15 mg (15 mg Intravenous Given 10/17/20 0251)    ED Course  I have reviewed the triage vital signs and the nursing notes.  Pertinent labs & imaging results that were available during my care of the patient were reviewed by me and considered in my medical decision making (see chart for details).    MDM Rules/Calculators/A&P                         Patient presents to the ED with complaints of abdominal pain. Patient nontoxic appearing, in no apparent distress,  vitals without significant abnormality. RUQ/RLQ abdominal tenderness w/o peritoneal signs.   Additional history obtained:  Additional history obtained from chart review & nursing note review.   Lab Tests:  I Ordered, reviewed, and interpreted labs, which included:  CBC: Unremarkable CMP: Elevated LFTs. Preserved renal function.  Lipase: WNL  UA: Unremarkable.   I have ordered morphine, zofran, & fluids. Plan for CT A/P for further assessment.   Imaging Studies ordered:  I ordered imaging studies which included CT abdomen/pelvis, I independently reviewed, formal radiology impression shows:  Appearance suggestive of an epiploic appendagitis along the ascending colon in the right hemiabdomen. Normal appendix. Mild bladder wall thickening, likely related to underdistention though could correlate for urinalysis. Postsurgical changes of the right scrotum and inguinal canal, may correlate  with surgical history prior scrotal hematoma evacuation and hydrocelectomy. Small fat containing bilateral inguinal hernias. Hepatic steatosis.  ED Course:  Following morphine and Zofran patient is feeling better, 5 out of 10 in severity pain, will give Toradol and p.o. challenge.  Patient feeling improved, tolerating p.o., overall appears appropriate for discharge home.  Repeat abdominal exam remains without peritoneal signs, CT scan overall reassuring, findings of epiploic appendagitis, low suspicion for acute surgical process, will treat with NSAIDs, Zofran, and PCP follow-up. I discussed results, treatment plan, need for follow-up, and return precautions with the patient and family member at bedside.  Provided opportunity for questions, patient and family confirmed understanding and are in agreement with plan.   Portions of this note were generated with Scientist, clinical (histocompatibility and immunogenetics). Dictation errors may occur despite best attempts at proofreading.  Final Clinical Impression(s) / ED Diagnoses Final diagnoses:   Epiploic appendagitis  Elevated LFTs    Rx / DC Orders ED Discharge Orders         Ordered    naproxen (NAPROSYN) 500 MG tablet  2 times daily PRN        10/17/20 0428    ondansetron (ZOFRAN ODT) 4 MG disintegrating tablet  Every 8 hours PRN        10/17/20 0428           Shenelle Klas, Pleas Koch, PA-C 10/17/20 [id number]    Geoffery Lyons, MD 10/17/20 [phone]

## 2020-10-16 NOTE — ED Notes (Signed)
Pt ambulatory to restroom and back.

## 2020-10-16 NOTE — ED Triage Notes (Signed)
Patient reports R sided abdominal with nausea and vomiting

## 2020-10-17 ENCOUNTER — Emergency Department (HOSPITAL_COMMUNITY): Payer: BC Managed Care – PPO

## 2020-10-17 DIAGNOSIS — R1110 Vomiting, unspecified: Secondary | ICD-10-CM | POA: Diagnosis not present

## 2020-10-17 DIAGNOSIS — K760 Fatty (change of) liver, not elsewhere classified: Secondary | ICD-10-CM | POA: Diagnosis not present

## 2020-10-17 DIAGNOSIS — K388 Other specified diseases of appendix: Secondary | ICD-10-CM | POA: Diagnosis not present

## 2020-10-17 DIAGNOSIS — R109 Unspecified abdominal pain: Secondary | ICD-10-CM | POA: Diagnosis not present

## 2020-10-17 DIAGNOSIS — K659 Peritonitis, unspecified: Secondary | ICD-10-CM | POA: Diagnosis not present

## 2020-10-17 MED ORDER — KETOROLAC TROMETHAMINE 15 MG/ML IJ SOLN
15.0000 mg | Freq: Once | INTRAMUSCULAR | Status: AC
  Administered 2020-10-17: 15 mg via INTRAVENOUS
  Filled 2020-10-17: qty 1

## 2020-10-17 MED ORDER — NAPROXEN 500 MG PO TABS: 500.0000 mg | ORAL_TABLET | Freq: Two times a day (BID) | ORAL | 0 refills | Status: AC | PRN

## 2020-10-17 MED ORDER — IOHEXOL 300 MG/ML  SOLN
100.0000 mL | Freq: Once | INTRAMUSCULAR | Status: AC | PRN
  Administered 2020-10-17: 100 mL via INTRAVENOUS

## 2020-10-17 MED ORDER — ONDANSETRON 4 MG PO TBDP: 4.0000 mg | ORAL_TABLET | Freq: Three times a day (TID) | ORAL | 0 refills | Status: AC | PRN

## 2020-10-17 NOTE — ED Notes (Signed)
Patient verbalizes understanding of discharge instructions. Opportunity for questioning and answers were provided. Armband removed by staff, pt discharged from ED ambulatory.   

## 2020-10-17 NOTE — ED Notes (Signed)
This RN gave pt cup of water to sip on. So far pt has drank half of the cup without vomiting.

## 2020-10-17 NOTE — Discharge Instructions (Addendum)
You were seen in the emergency department today for abdominal pain.  Your CT scan showed findings of epiploic appendagitis, please see attached handout.  Your labs show that your liver function tests are mildly elevated, please have these rechecked by your primary care provider.  We are sending home with the following medicines: - Zofran: Take every 8 hours as needed for nausea and vomiting - Naproxen is a nonsteroidal anti-inflammatory medication that will help with pain and swelling. Be sure to take this medication as prescribed with food, 1 pill every 12 hours,  It should be taken with food, as it can cause stomach upset, and more seriously, stomach bleeding. Do not take other nonsteroidal anti-inflammatory medications with this such as Advil, Motrin, Aleve, Mobic, Goodie Powder, or Motrin.    We have prescribed you new medication(s) today. Discuss the medications prescribed today with your pharmacist as they can have adverse effects and interactions with your other medicines including over the counter and prescribed medications. Seek medical evaluation if you start to experience new or abnormal symptoms after taking one of these medicines, seek care immediately if you start to experience difficulty breathing, feeling of your throat closing, facial swelling, or rash as these could be indications of a more serious allergic reaction   Please follow with primary care provider, call the office on Monday morning to schedule soonest possible follow-up.  Return to the ER for new or worsening symptoms including but not limited to new or worsening pain, inability to keep fluids down, fever, blood in vomit or stool, passing out, chest pain, trouble breathing, or any other concerns  Google translate  Hoy lo vieron en el departamento de emergencias por dolor abdominal. Su tomografa computarizada mostr hallazgos de apendagitis epiploica, consulte el folleto adjunto. Sus anlisis muestran que sus pruebas de funcin  heptica estn levemente elevadas, haga que su proveedor de atencin primaria las vuelva a Dentist. Estamos enviando a casa con los siguientes medicamentos: - Zofran: tomar cada 8 horas segn sea necesario para las nuseas y los vmitos - El naproxeno es un medicamento antiinflamatorio no esteroideo que ayudar con Chief Technology Officer y la hinchazn. Asegrese de tomar [employer] segn lo prescrito con alimentos, 1 pastilla cada 12 horas. Debe tomarse con alimentos, ya que puede causar [employer] y, lo que es ms grave, sangrado estomacal. No tome otros medicamentos antiinflamatorios no esteroideos con esto, como Advil, Motrin, Aleve, Mobic, Goodie Powder o Motrin.  Le hemos recetado nuevos medicamentos hoy. Hable sobre los medicamentos recetados hoy con su farmacutico, ya que pueden tener efectos adversos e interacciones con sus otros medicamentos, incluidos los medicamentos recetados y de [place]. Busque una evaluacin mdica si comienza a experimentar sntomas nuevos o anormales despus de tomar uno de estos medicamentos, busque atencin mdica de inmediato si comienza a experimentar dificultad para respirar, sensacin de que se le cierra la garganta, hinchazn facial o sarpullido, ya que estos podran ser indicios de una enfermedad ms grave. reaccin alrgica   Siga con el proveedor de atencin primaria, llame a la oficina el lunes por la maana para programar el seguimiento lo ms pronto posible. Regrese a la sala de emergencias por sntomas nuevos o que empeoran, incluidos, entre otros, dolor nuevo o que [place], incapacidad para retener lquidos, fiebre, Retail buyer en el vmito o las heces, [place], dolor en el pecho, dificultad para respirar o cualquier otra inquietud.

## 2020-10-18 ENCOUNTER — Other Ambulatory Visit (HOSPITAL_COMMUNITY): Payer: Self-pay | Admitting: Gastroenterology

## 2020-10-18 ENCOUNTER — Other Ambulatory Visit: Payer: Self-pay | Admitting: Gastroenterology

## 2020-10-18 ENCOUNTER — Ambulatory Visit (HOSPITAL_COMMUNITY)
Admission: RE | Admit: 2020-10-18 | Discharge: 2020-10-18 | Disposition: A | Discharge status: Home / Self Care | Payer: BC Managed Care – PPO | Source: Ambulatory Visit | Attending: Gastroenterology | Admitting: Gastroenterology

## 2020-10-18 ENCOUNTER — Other Ambulatory Visit: Payer: Self-pay

## 2020-10-18 DIAGNOSIS — R1011 Right upper quadrant pain: Secondary | ICD-10-CM

## 2020-10-18 DIAGNOSIS — K581 Irritable bowel syndrome with constipation: Secondary | ICD-10-CM | POA: Diagnosis not present

## 2020-10-18 DIAGNOSIS — K4020 Bilateral inguinal hernia, without obstruction or gangrene, not specified as recurrent: Secondary | ICD-10-CM | POA: Diagnosis not present

## 2020-10-18 DIAGNOSIS — K760 Fatty (change of) liver, not elsewhere classified: Secondary | ICD-10-CM | POA: Diagnosis not present

## 2020-11-05 ENCOUNTER — Ambulatory Visit (HOSPITAL_COMMUNITY)
Admission: RE | Admit: 2020-11-05 | Discharge: 2020-11-05 | Disposition: A | Discharge status: Home / Self Care | Payer: BC Managed Care – PPO | Source: Ambulatory Visit | Attending: Gastroenterology | Admitting: Gastroenterology

## 2020-11-05 ENCOUNTER — Other Ambulatory Visit: Payer: Self-pay

## 2020-11-05 DIAGNOSIS — R109 Unspecified abdominal pain: Secondary | ICD-10-CM | POA: Diagnosis not present

## 2020-11-05 DIAGNOSIS — R1011 Right upper quadrant pain: Secondary | ICD-10-CM | POA: Insufficient documentation

## 2020-11-05 MED ORDER — TECHNETIUM TC 99M MEBROFENIN IV KIT
5.3000 | PACK | Freq: Once | INTRAVENOUS | Status: AC | PRN
  Administered 2020-11-05: 5.3 via INTRAVENOUS

## 2020-12-09 DIAGNOSIS — R7989 Other specified abnormal findings of blood chemistry: Secondary | ICD-10-CM | POA: Diagnosis not present

## 2020-12-09 DIAGNOSIS — I10 Essential (primary) hypertension: Secondary | ICD-10-CM | POA: Diagnosis not present

## 2020-12-09 DIAGNOSIS — R7301 Impaired fasting glucose: Secondary | ICD-10-CM | POA: Diagnosis not present

## 2020-12-09 DIAGNOSIS — E781 Pure hyperglyceridemia: Secondary | ICD-10-CM | POA: Diagnosis not present

## 2020-12-16 DIAGNOSIS — M25512 Pain in left shoulder: Secondary | ICD-10-CM | POA: Diagnosis not present

## 2020-12-16 DIAGNOSIS — E781 Pure hyperglyceridemia: Secondary | ICD-10-CM | POA: Diagnosis not present

## 2020-12-16 DIAGNOSIS — Z0000 Encounter for general adult medical examination without abnormal findings: Secondary | ICD-10-CM | POA: Diagnosis not present

## 2020-12-16 DIAGNOSIS — M25511 Pain in right shoulder: Secondary | ICD-10-CM | POA: Diagnosis not present

## 2020-12-16 DIAGNOSIS — I10 Essential (primary) hypertension: Secondary | ICD-10-CM | POA: Diagnosis not present

## 2020-12-16 DIAGNOSIS — R7303 Prediabetes: Secondary | ICD-10-CM | POA: Diagnosis not present

## 2021-01-30 ENCOUNTER — Encounter (HOSPITAL_BASED_OUTPATIENT_CLINIC_OR_DEPARTMENT_OTHER): Payer: Self-pay

## 2021-01-30 ENCOUNTER — Other Ambulatory Visit: Payer: Self-pay

## 2021-01-30 DIAGNOSIS — F17210 Nicotine dependence, cigarettes, uncomplicated: Secondary | ICD-10-CM | POA: Insufficient documentation

## 2021-01-30 DIAGNOSIS — L299 Pruritus, unspecified: Secondary | ICD-10-CM | POA: Diagnosis not present

## 2021-01-30 DIAGNOSIS — L237 Allergic contact dermatitis due to plants, except food: Secondary | ICD-10-CM | POA: Diagnosis not present

## 2021-01-30 NOTE — ED Triage Notes (Addendum)
Patient here POV from Home with Poison Ivy.  Patient was working outside on Thursday when he came into contact with [employer]. Itchiness has not subsided and has only worsened. Patient has diffuse itching on the entire body.  Ambulatory, GCS 15. Patient has attempted [employer] which initially had relief but Mild Relief now. Patient has attempted Benadryl Lotion and Cortisone as well.   No SOB, No CP.

## 2021-01-31 ENCOUNTER — Emergency Department (HOSPITAL_BASED_OUTPATIENT_CLINIC_OR_DEPARTMENT_OTHER)
Admission: EM | Admit: 2021-01-31 | Discharge: 2021-01-31 | Disposition: A | Discharge status: Home / Self Care | Payer: BC Managed Care – PPO | Attending: Emergency Medicine | Admitting: Emergency Medicine

## 2021-01-31 DIAGNOSIS — R2232 Localized swelling, mass and lump, left upper limb: Secondary | ICD-10-CM | POA: Diagnosis not present

## 2021-01-31 DIAGNOSIS — L237 Allergic contact dermatitis due to plants, except food: Secondary | ICD-10-CM

## 2021-01-31 DIAGNOSIS — L255 Unspecified contact dermatitis due to plants, except food: Secondary | ICD-10-CM | POA: Diagnosis not present

## 2021-01-31 MED ORDER — TRIAMCINOLONE ACETONIDE 0.1 % EX CREA: 1.0000 "application " | TOPICAL_CREAM | Freq: Two times a day (BID) | CUTANEOUS | 0 refills | Status: AC

## 2021-01-31 NOTE — ED Provider Notes (Signed)
[facility] EMERGENCY DEPT Provider Note   CSN: [id number] Arrival date & time: 01/30/21  2255     History Chief Complaint  Patient presents with   Pruritis    Todd Wong is a 35 y.o. male.  HPI     This is a 35 year old male who presents with concern for poison ivy.  Patient reports that he was cutting down trees for several days.  He began to have itchiness and pain that started on Friday.  He states it started in his arms and legs but now is over his trunk and neck as well.  He has used calamine lotion with mild relief.  He states his skin feels tight especially over his hands.  Denies shortness of breath or throat swelling.  Past Medical History:  Diagnosis Date   GERD (gastroesophageal reflux disease)    Hyperlipidemia    Right hydrocele     Patient Active Problem List   Diagnosis Date Noted   Scrotal hematoma 06/10/2018    Past Surgical History:  Procedure Laterality Date   HYDROCELE EXCISION  age 89   HYDROCELE EXCISION Right 05/24/2018   Procedure: HYDROCELECTOMY ADULT;  Surgeon: Marcine Matar, MD;  Location: [facility];  Service: Urology;  Laterality: Right;   HYDROCELECTOMY Right 06-08-2017;   08-25-2009 (@[facility] )   ORIF FIBULA FRACTURE Left 06/25/2015   Procedure: LEFT OPEN REDUCTION INTERNAL FIXATION (ORIF) DISTAL FIBULA LATERAL MALLEOLUS WITH SYNDESMOSIS ;  Surgeon: 08/23/2015, MD;  Location: [facility];  Service: Orthopedics;  Laterality: Left;   SCROTAL EXPLORATION N/A 06/10/2018   Procedure: SCROTUM EXPLORATION AND DRAINAGE OF HEMATOMA;  Surgeon: 06/12/2018, MD;  Location: [facility];  Service: Urology;  Laterality: N/A;       No family history on file.  Social History   Tobacco Use   Smoking status: Some Days    Years: 5.00    Types: Cigarettes   Smokeless tobacco: Never   Tobacco comments:    1 ppmonth  Substance Use Topics   Alcohol use: Yes    Comment: occasional   Drug use: Never     Home Medications Prior to Admission medications   Medication Sig Start Date End Date Taking? Authorizing Provider  triamcinolone cream (KENALOG) 0.1 % Apply 1 application topically 2 (two) times daily. 01/31/21  Yes Ishmeal Rorie, 02/02/21, MD  acetaminophen (TYLENOL) 500 MG tablet Take 1,000 mg by mouth every 6 (six) hours as needed for moderate pain or headache.    [provider]  bisoprolol (ZEBETA) 5 MG tablet Take 5 mg by mouth 2 (two) times daily. 07/29/20   [provider]  Cholecalciferol (VITAMIN D3) 250 MCG (10000 UT) TABS Take 1,000 Units by mouth daily.    [provider]  CINNAMON PO Take 1 capsule by mouth daily.    [provider]  fenofibrate 160 MG tablet Take 160 mg by mouth every evening.  06/10/15   [provider]  IRON PO Take 1 tablet by mouth daily. OTC    [provider]  naproxen (NAPROSYN) 500 MG tablet Take 1 tablet (500 mg total) by mouth 2 (two) times daily as needed for moderate pain. 10/17/20   Petrucelli, Samantha R, PA-C  ondansetron (ZOFRAN ODT) 4 MG disintegrating tablet Take 1 tablet (4 mg total) by mouth every 8 (eight) hours as needed for nausea or vomiting. 10/17/20   Petrucelli, Samantha R, PA-C  pantoprazole (PROTONIX) 40 MG tablet Take 40 mg by mouth daily. 07/31/20  [provider]  pravastatin (PRAVACHOL) 40 MG tablet Take 40 mg by mouth every evening. 06/07/20   [provider]  VASCEPA 1 g capsule Take 2 g by mouth 2 (two) times daily. 10/03/20   [provider]  vitamin E (VITAMIN E) 180 MG (400 UNITS) capsule Take 400 Units by mouth daily.    [provider]    Allergies    Patient has no known allergies.  Review of Systems   Review of Systems  Constitutional:  Negative for fever.  HENT:  Negative for sore throat and trouble swallowing.   Respiratory:  Negative for shortness of breath.   Skin:  Positive for rash.  All other systems reviewed and are  negative.  Physical Exam Updated Vital Signs BP 126/79   Pulse (!) 59   Temp 98.4 F (36.9 C) (Oral)   Resp 17   Ht 1.702 m (5\' 7" )   Wt 112.5 kg   SpO2 99%   BMI 38.84 kg/m   Physical Exam Vitals and nursing note reviewed.  Constitutional:      Appearance: He is well-developed. He is obese. He is not ill-appearing.  HENT:     Head: Normocephalic and atraumatic.     Nose: Nose normal.     Mouth/Throat:     Mouth: Mucous membranes are moist.     Pharynx: Oropharynx is clear.  Eyes:     Pupils: Pupils are equal, round, and reactive to light.  Cardiovascular:     Rate and Rhythm: Normal rate and regular rhythm.     Heart sounds: Normal heart sounds. No murmur heard. Pulmonary:     Effort: Pulmonary effort is normal. No respiratory distress.     Breath sounds: Normal breath sounds. No wheezing.  Abdominal:     Palpations: Abdomen is soft.     Tenderness: There is no abdominal tenderness.  Musculoskeletal:     Cervical back: Neck supple.     Comments: Slight symmetric swelling of the bilateral hands, no  Lymphadenopathy:     Cervical: No cervical adenopathy.  Skin:    General: Skin is warm and dry.     Comments: Somewhat difficult to assess as patient has calamine lotion over his trunk, bilateral upper and lower extremities; however, there are some patches of vesicles over the bilateral lower extremities with excoriations, less obvious over the arms and neck, no significant erythema  Neurological:     Mental Status: He is alert and oriented to person, place, and time.  Psychiatric:        Mood and Affect: Mood normal.    ED Results / Procedures / Treatments   Labs (all labs ordered are listed, but only abnormal results are displayed) Labs Reviewed - No data to display  EKG None  Radiology No results found.  Procedures Procedures   Medications Ordered in ED Medications - No data to display  ED Course  I have reviewed the triage vital signs and the nursing  notes.  Pertinent labs & imaging results that were available during my care of the patient were reviewed by me and considered in my medical decision making (see chart for details).    MDM Rules/Calculators/A&P                           Patient presents with concerns for poison ivy exposure.  He is overall nontoxic and vital signs are reassuring.  No signs or symptoms of anaphylaxis.  His significant exam is limited because he has calamine lotion applied in the areas of importance; however, he does have characteristic contact dermatitis over the lower extremities.  No signs or symptoms of cellulitis.  He is not systemically ill appearing.  Will provide with a topical steroid.  Recommend oral Benadryl for itching.  He was instructed to wash all close in hot water and avoid scratching if at all possible.  After history, exam, and medical workup I feel the patient has been appropriately medically screened and is safe for discharge home. Pertinent diagnoses were discussed with the patient. Patient was given return precautions.   Final Clinical Impression(s) / ED Diagnoses Final diagnoses:  Contact dermatitis due to poison ivy    Rx / DC Orders ED Discharge Orders          Ordered    triamcinolone cream (KENALOG) 0.1 %  2 times daily        01/31/21 0224             Zakeya Junker, Mayer Masker, MD 01/31/21 [phone]

## 2021-01-31 NOTE — ED Notes (Signed)
Pt verbalizes understanding of discharge instructions. Opportunity for questioning and answers were provided. Armand removed by staff, pt discharged from ED to home. Educated to cleanse skin and apply Rx cream.

## 2021-01-31 NOTE — Discharge Instructions (Addendum)
You were seen today for likely poison ivy.  Do not apply steroid cream to your face.  Continue Benadryl as needed.  You may apply her steroid cream to the other parts of your body.  Make sure that you are washing your linens in hot water.  Avoid scratching if you can.

## 2021-06-30 DIAGNOSIS — E781 Pure hyperglyceridemia: Secondary | ICD-10-CM | POA: Diagnosis not present

## 2021-06-30 DIAGNOSIS — R7303 Prediabetes: Secondary | ICD-10-CM | POA: Diagnosis not present

## 2021-06-30 DIAGNOSIS — K760 Fatty (change of) liver, not elsewhere classified: Secondary | ICD-10-CM | POA: Diagnosis not present

## 2021-07-06 DIAGNOSIS — N50811 Right testicular pain: Secondary | ICD-10-CM | POA: Diagnosis not present

## 2021-07-20 DIAGNOSIS — K760 Fatty (change of) liver, not elsewhere classified: Secondary | ICD-10-CM | POA: Diagnosis not present

## 2021-07-20 DIAGNOSIS — E781 Pure hyperglyceridemia: Secondary | ICD-10-CM | POA: Diagnosis not present

## 2021-07-20 DIAGNOSIS — R7303 Prediabetes: Secondary | ICD-10-CM | POA: Diagnosis not present

## 2021-09-15 DIAGNOSIS — N50811 Right testicular pain: Secondary | ICD-10-CM | POA: Diagnosis not present

## 2021-10-06 DIAGNOSIS — N50811 Right testicular pain: Secondary | ICD-10-CM | POA: Diagnosis not present

## 2021-11-07 DIAGNOSIS — N50811 Right testicular pain: Secondary | ICD-10-CM | POA: Diagnosis not present

## 2021-11-07 DIAGNOSIS — N433 Hydrocele, unspecified: Secondary | ICD-10-CM | POA: Diagnosis not present

## 2021-12-08 DIAGNOSIS — N433 Hydrocele, unspecified: Secondary | ICD-10-CM | POA: Diagnosis not present

## 2021-12-08 DIAGNOSIS — N50811 Right testicular pain: Secondary | ICD-10-CM | POA: Diagnosis not present

## 2022-01-12 DIAGNOSIS — R7989 Other specified abnormal findings of blood chemistry: Secondary | ICD-10-CM | POA: Diagnosis not present

## 2022-01-12 DIAGNOSIS — E781 Pure hyperglyceridemia: Secondary | ICD-10-CM | POA: Diagnosis not present

## 2022-01-12 DIAGNOSIS — K760 Fatty (change of) liver, not elsewhere classified: Secondary | ICD-10-CM | POA: Diagnosis not present

## 2022-01-12 DIAGNOSIS — R7303 Prediabetes: Secondary | ICD-10-CM | POA: Diagnosis not present

## 2022-01-19 DIAGNOSIS — Z0000 Encounter for general adult medical examination without abnormal findings: Secondary | ICD-10-CM | POA: Diagnosis not present

## 2022-01-19 DIAGNOSIS — E781 Pure hyperglyceridemia: Secondary | ICD-10-CM | POA: Diagnosis not present

## 2022-01-23 DIAGNOSIS — N50811 Right testicular pain: Secondary | ICD-10-CM | POA: Diagnosis not present

## 2022-01-23 DIAGNOSIS — N430 Encysted hydrocele: Secondary | ICD-10-CM | POA: Diagnosis not present

## 2022-03-03 DIAGNOSIS — R194 Change in bowel habit: Secondary | ICD-10-CM | POA: Diagnosis not present

## 2022-03-10 DIAGNOSIS — K760 Fatty (change of) liver, not elsewhere classified: Secondary | ICD-10-CM | POA: Diagnosis not present

## 2022-03-10 DIAGNOSIS — R748 Abnormal levels of other serum enzymes: Secondary | ICD-10-CM | POA: Diagnosis not present

## 2022-07-26 DIAGNOSIS — R7301 Impaired fasting glucose: Secondary | ICD-10-CM | POA: Diagnosis not present

## 2022-07-26 DIAGNOSIS — K760 Fatty (change of) liver, not elsewhere classified: Secondary | ICD-10-CM | POA: Diagnosis not present

## 2022-07-26 DIAGNOSIS — I10 Essential (primary) hypertension: Secondary | ICD-10-CM | POA: Diagnosis not present

## 2022-07-26 DIAGNOSIS — K219 Gastro-esophageal reflux disease without esophagitis: Secondary | ICD-10-CM | POA: Diagnosis not present

## 2022-07-26 DIAGNOSIS — E781 Pure hyperglyceridemia: Secondary | ICD-10-CM | POA: Diagnosis not present

## 2022-08-01 DIAGNOSIS — R509 Fever, unspecified: Secondary | ICD-10-CM | POA: Diagnosis not present

## 2022-08-23 DIAGNOSIS — E118 Type 2 diabetes mellitus with unspecified complications: Secondary | ICD-10-CM | POA: Diagnosis not present

## 2022-08-23 DIAGNOSIS — R7989 Other specified abnormal findings of blood chemistry: Secondary | ICD-10-CM | POA: Diagnosis not present

## 2022-08-23 DIAGNOSIS — E781 Pure hyperglyceridemia: Secondary | ICD-10-CM | POA: Diagnosis not present

## 2022-08-23 DIAGNOSIS — K760 Fatty (change of) liver, not elsewhere classified: Secondary | ICD-10-CM | POA: Diagnosis not present

## 2022-11-01 DIAGNOSIS — L237 Allergic contact dermatitis due to plants, except food: Secondary | ICD-10-CM | POA: Diagnosis not present

## 2022-12-12 DIAGNOSIS — E785 Hyperlipidemia, unspecified: Secondary | ICD-10-CM | POA: Diagnosis not present

## 2022-12-12 DIAGNOSIS — K760 Fatty (change of) liver, not elsewhere classified: Secondary | ICD-10-CM | POA: Diagnosis not present

## 2022-12-12 DIAGNOSIS — E118 Type 2 diabetes mellitus with unspecified complications: Secondary | ICD-10-CM | POA: Diagnosis not present

## 2022-12-19 DIAGNOSIS — E785 Hyperlipidemia, unspecified: Secondary | ICD-10-CM | POA: Diagnosis not present

## 2022-12-19 DIAGNOSIS — E781 Pure hyperglyceridemia: Secondary | ICD-10-CM | POA: Diagnosis not present

## 2022-12-19 DIAGNOSIS — K760 Fatty (change of) liver, not elsewhere classified: Secondary | ICD-10-CM | POA: Diagnosis not present

## 2022-12-19 DIAGNOSIS — E118 Type 2 diabetes mellitus with unspecified complications: Secondary | ICD-10-CM | POA: Diagnosis not present

## 2023-01-01 ENCOUNTER — Other Ambulatory Visit: Payer: Self-pay | Admitting: Gastroenterology

## 2023-01-01 DIAGNOSIS — R1032 Left lower quadrant pain: Secondary | ICD-10-CM

## 2023-01-02 DIAGNOSIS — R1012 Left upper quadrant pain: Secondary | ICD-10-CM | POA: Diagnosis not present

## 2023-01-02 DIAGNOSIS — K219 Gastro-esophageal reflux disease without esophagitis: Secondary | ICD-10-CM | POA: Diagnosis not present

## 2023-01-02 DIAGNOSIS — R748 Abnormal levels of other serum enzymes: Secondary | ICD-10-CM | POA: Diagnosis not present

## 2023-01-02 DIAGNOSIS — K5900 Constipation, unspecified: Secondary | ICD-10-CM | POA: Diagnosis not present

## 2023-01-03 ENCOUNTER — Other Ambulatory Visit: Payer: Self-pay | Admitting: Gastroenterology

## 2023-01-03 DIAGNOSIS — R7989 Other specified abnormal findings of blood chemistry: Secondary | ICD-10-CM

## 2023-01-09 ENCOUNTER — Ambulatory Visit
Admission: RE | Admit: 2023-01-09 | Discharge: 2023-01-09 | Disposition: A | Discharge status: Home / Self Care | Payer: BC Managed Care – PPO | Source: Ambulatory Visit | Attending: Gastroenterology | Admitting: Gastroenterology

## 2023-01-09 DIAGNOSIS — R7989 Other specified abnormal findings of blood chemistry: Secondary | ICD-10-CM

## 2023-01-09 DIAGNOSIS — N200 Calculus of kidney: Secondary | ICD-10-CM | POA: Diagnosis not present

## 2023-01-10 ENCOUNTER — Ambulatory Visit
Admission: RE | Admit: 2023-01-10 | Discharge: 2023-01-10 | Disposition: A | Discharge status: Home / Self Care | Payer: BC Managed Care – PPO | Source: Ambulatory Visit | Attending: Gastroenterology | Admitting: Gastroenterology

## 2023-01-10 DIAGNOSIS — R7989 Other specified abnormal findings of blood chemistry: Secondary | ICD-10-CM | POA: Diagnosis not present

## 2023-01-10 DIAGNOSIS — N281 Cyst of kidney, acquired: Secondary | ICD-10-CM | POA: Diagnosis not present

## 2023-01-10 DIAGNOSIS — R1032 Left lower quadrant pain: Secondary | ICD-10-CM

## 2023-01-10 MED ORDER — IOPAMIDOL (ISOVUE-300) INJECTION 61%
100.0000 mL | Freq: Once | INTRAVENOUS | Status: AC | PRN
  Administered 2023-01-10: 100 mL via INTRAVENOUS

## 2023-01-11 ENCOUNTER — Other Ambulatory Visit: Payer: BC Managed Care – PPO

## 2023-01-25 DIAGNOSIS — R82998 Other abnormal findings in urine: Secondary | ICD-10-CM | POA: Diagnosis not present

## 2023-01-25 DIAGNOSIS — R109 Unspecified abdominal pain: Secondary | ICD-10-CM | POA: Diagnosis not present

## 2023-01-25 DIAGNOSIS — M549 Dorsalgia, unspecified: Secondary | ICD-10-CM | POA: Diagnosis not present

## 2023-01-25 DIAGNOSIS — N281 Cyst of kidney, acquired: Secondary | ICD-10-CM | POA: Diagnosis not present

## 2023-03-27 DIAGNOSIS — K760 Fatty (change of) liver, not elsewhere classified: Secondary | ICD-10-CM | POA: Diagnosis not present

## 2023-03-27 DIAGNOSIS — E118 Type 2 diabetes mellitus with unspecified complications: Secondary | ICD-10-CM | POA: Diagnosis not present

## 2023-03-27 DIAGNOSIS — E785 Hyperlipidemia, unspecified: Secondary | ICD-10-CM | POA: Diagnosis not present

## 2023-03-27 DIAGNOSIS — E781 Pure hyperglyceridemia: Secondary | ICD-10-CM | POA: Diagnosis not present

## 2023-04-03 DIAGNOSIS — Z0000 Encounter for general adult medical examination without abnormal findings: Secondary | ICD-10-CM | POA: Diagnosis not present

## 2023-04-03 DIAGNOSIS — K760 Fatty (change of) liver, not elsewhere classified: Secondary | ICD-10-CM | POA: Diagnosis not present

## 2023-04-03 DIAGNOSIS — Z23 Encounter for immunization: Secondary | ICD-10-CM | POA: Diagnosis not present

## 2023-04-03 DIAGNOSIS — E118 Type 2 diabetes mellitus with unspecified complications: Secondary | ICD-10-CM | POA: Diagnosis not present

## 2023-04-03 DIAGNOSIS — E781 Pure hyperglyceridemia: Secondary | ICD-10-CM | POA: Diagnosis not present

## 2023-04-03 DIAGNOSIS — K219 Gastro-esophageal reflux disease without esophagitis: Secondary | ICD-10-CM | POA: Diagnosis not present

## 2023-07-03 DIAGNOSIS — E118 Type 2 diabetes mellitus with unspecified complications: Secondary | ICD-10-CM | POA: Diagnosis not present

## 2023-07-03 DIAGNOSIS — E781 Pure hyperglyceridemia: Secondary | ICD-10-CM | POA: Diagnosis not present

## 2023-07-03 DIAGNOSIS — Z79899 Other long term (current) drug therapy: Secondary | ICD-10-CM | POA: Diagnosis not present

## 2023-07-10 DIAGNOSIS — I10 Essential (primary) hypertension: Secondary | ICD-10-CM | POA: Diagnosis not present

## 2023-07-10 DIAGNOSIS — K219 Gastro-esophageal reflux disease without esophagitis: Secondary | ICD-10-CM | POA: Diagnosis not present

## 2023-07-10 DIAGNOSIS — E781 Pure hyperglyceridemia: Secondary | ICD-10-CM | POA: Diagnosis not present

## 2023-07-10 DIAGNOSIS — E118 Type 2 diabetes mellitus with unspecified complications: Secondary | ICD-10-CM | POA: Diagnosis not present

## 2023-10-11 DIAGNOSIS — I10 Essential (primary) hypertension: Secondary | ICD-10-CM | POA: Diagnosis not present

## 2023-10-11 DIAGNOSIS — K760 Fatty (change of) liver, not elsewhere classified: Secondary | ICD-10-CM | POA: Diagnosis not present

## 2023-10-11 DIAGNOSIS — K219 Gastro-esophageal reflux disease without esophagitis: Secondary | ICD-10-CM | POA: Diagnosis not present

## 2023-10-11 DIAGNOSIS — E118 Type 2 diabetes mellitus with unspecified complications: Secondary | ICD-10-CM | POA: Diagnosis not present

## 2023-10-18 DIAGNOSIS — K219 Gastro-esophageal reflux disease without esophagitis: Secondary | ICD-10-CM | POA: Diagnosis not present

## 2023-10-18 DIAGNOSIS — E118 Type 2 diabetes mellitus with unspecified complications: Secondary | ICD-10-CM | POA: Diagnosis not present

## 2023-10-18 DIAGNOSIS — I10 Essential (primary) hypertension: Secondary | ICD-10-CM | POA: Diagnosis not present

## 2023-10-18 DIAGNOSIS — E781 Pure hyperglyceridemia: Secondary | ICD-10-CM | POA: Diagnosis not present

## 2024-03-12 DIAGNOSIS — K219 Gastro-esophageal reflux disease without esophagitis: Secondary | ICD-10-CM | POA: Diagnosis not present

## 2024-03-12 DIAGNOSIS — E118 Type 2 diabetes mellitus with unspecified complications: Secondary | ICD-10-CM | POA: Diagnosis not present

## 2024-03-12 DIAGNOSIS — R7989 Other specified abnormal findings of blood chemistry: Secondary | ICD-10-CM | POA: Diagnosis not present

## 2024-03-12 DIAGNOSIS — K760 Fatty (change of) liver, not elsewhere classified: Secondary | ICD-10-CM | POA: Diagnosis not present

## 2024-03-12 DIAGNOSIS — E781 Pure hyperglyceridemia: Secondary | ICD-10-CM | POA: Diagnosis not present

## 2024-03-12 DIAGNOSIS — I10 Essential (primary) hypertension: Secondary | ICD-10-CM | POA: Diagnosis not present

## 2024-03-19 DIAGNOSIS — Z23 Encounter for immunization: Secondary | ICD-10-CM | POA: Diagnosis not present

## 2024-03-19 DIAGNOSIS — E118 Type 2 diabetes mellitus with unspecified complications: Secondary | ICD-10-CM | POA: Diagnosis not present

## 2024-03-19 DIAGNOSIS — E785 Hyperlipidemia, unspecified: Secondary | ICD-10-CM | POA: Diagnosis not present

## 2024-03-19 DIAGNOSIS — Z0000 Encounter for general adult medical examination without abnormal findings: Secondary | ICD-10-CM | POA: Diagnosis not present

## 2024-03-19 DIAGNOSIS — K219 Gastro-esophageal reflux disease without esophagitis: Secondary | ICD-10-CM | POA: Diagnosis not present
